# Patient Record
Sex: Male | Born: 1965
Health system: Southern US, Community
[De-identification: ages and names within clinical notes are randomized; demographics above are authoritative.]

## PROBLEM LIST (undated history)

## (undated) DIAGNOSIS — K5900 Constipation, unspecified: Secondary | ICD-10-CM

## (undated) DIAGNOSIS — Z789 Other specified health status: Secondary | ICD-10-CM

## (undated) DIAGNOSIS — Z8249 Family history of ischemic heart disease and other diseases of the circulatory system: Secondary | ICD-10-CM

## (undated) DIAGNOSIS — R7611 Nonspecific reaction to tuberculin skin test without active tuberculosis: Secondary | ICD-10-CM

## (undated) DIAGNOSIS — E785 Hyperlipidemia, unspecified: Secondary | ICD-10-CM

## (undated) HISTORY — DX: Hyperlipidemia, unspecified: E78.5

## (undated) HISTORY — DX: Other specified health status: Z78.9

## (undated) HISTORY — PX: NO PAST SURGERIES: SHX2092

## (undated) HISTORY — DX: Constipation, unspecified: K59.00

## (undated) HISTORY — DX: Family history of ischemic heart disease and other diseases of the circulatory system: Z82.49

---

## 1998-04-30 ENCOUNTER — Emergency Department (HOSPITAL_COMMUNITY): Admission: EM | Admit: 1998-04-30 | Discharge: 1998-04-30 | Payer: Self-pay | Admitting: Emergency Medicine

## 2003-01-30 ENCOUNTER — Emergency Department (HOSPITAL_COMMUNITY): Admission: EM | Admit: 2003-01-30 | Discharge: 2003-01-30 | Payer: Self-pay | Admitting: Emergency Medicine

## 2003-01-31 ENCOUNTER — Ambulatory Visit (HOSPITAL_COMMUNITY): Admission: RE | Admit: 2003-01-31 | Discharge: 2003-01-31 | Payer: Self-pay | Admitting: Emergency Medicine

## 2009-01-04 HISTORY — PX: COLONOSCOPY: SHX174

## 2009-12-04 ENCOUNTER — Encounter: Admission: RE | Admit: 2009-12-04 | Discharge: 2009-12-04 | Payer: Self-pay | Admitting: Family Medicine

## 2009-12-30 LAB — HM COLONOSCOPY

## 2010-11-04 ENCOUNTER — Emergency Department (HOSPITAL_COMMUNITY)
Admission: EM | Admit: 2010-11-04 | Discharge: 2010-11-04 | Disposition: A | Discharge status: Home / Self Care | Payer: Managed Care, Other (non HMO) | Attending: Emergency Medicine | Admitting: Emergency Medicine

## 2010-11-04 ENCOUNTER — Emergency Department (HOSPITAL_COMMUNITY): Payer: Managed Care, Other (non HMO)

## 2010-11-04 DIAGNOSIS — R1013 Epigastric pain: Secondary | ICD-10-CM | POA: Insufficient documentation

## 2010-11-04 DIAGNOSIS — Z79899 Other long term (current) drug therapy: Secondary | ICD-10-CM | POA: Insufficient documentation

## 2010-11-04 DIAGNOSIS — R079 Chest pain, unspecified: Secondary | ICD-10-CM | POA: Insufficient documentation

## 2010-11-04 LAB — CBC
HCT: 35.7 % — ABNORMAL LOW (ref 39.0–52.0)
Hemoglobin: 12 g/dL — ABNORMAL LOW (ref 13.0–17.0)
MCV: 83.8 fL (ref 78.0–100.0)
RBC: 4.26 MIL/uL (ref 4.22–5.81)
WBC: 3.7 10*3/uL — ABNORMAL LOW (ref 4.0–10.5)

## 2010-11-04 LAB — POCT I-STAT, CHEM 8
Chloride: 100 mEq/L (ref 96–112)
Glucose, Bld: 117 mg/dL — ABNORMAL HIGH (ref 70–99)
HCT: 40 % (ref 39.0–52.0)
Potassium: 3.7 mEq/L (ref 3.5–5.1)

## 2010-11-04 LAB — HEPATIC FUNCTION PANEL
ALT: 21 U/L (ref 0–53)
AST: 18 U/L (ref 0–37)
Bilirubin, Direct: <0.1 mg/dL (ref 0.0–0.3)
Total Bilirubin: 0.3 mg/dL (ref 0.3–1.2)

## 2010-11-04 LAB — DIFFERENTIAL
Eosinophils Relative: 1 % (ref 0–5)
Lymphocytes Relative: 58 % — ABNORMAL HIGH (ref 12–46)
Lymphs Abs: 2.2 10*3/uL (ref 0.7–4.0)
Neutrophils Relative %: 31 % — ABNORMAL LOW (ref 43–77)

## 2011-02-15 ENCOUNTER — Encounter (HOSPITAL_COMMUNITY): Payer: Self-pay | Admitting: Internal Medicine

## 2011-02-15 ENCOUNTER — Inpatient Hospital Stay (HOSPITAL_COMMUNITY)
Admission: EM | Admit: 2011-02-15 | Discharge: 2011-02-17 | DRG: 638 | Disposition: A | Discharge status: Home / Self Care | Payer: Managed Care, Other (non HMO) | Attending: Internal Medicine | Admitting: Internal Medicine

## 2011-02-15 DIAGNOSIS — Z87891 Personal history of nicotine dependence: Secondary | ICD-10-CM

## 2011-02-15 DIAGNOSIS — E876 Hypokalemia: Secondary | ICD-10-CM | POA: Diagnosis present

## 2011-02-15 DIAGNOSIS — Z833 Family history of diabetes mellitus: Secondary | ICD-10-CM

## 2011-02-15 DIAGNOSIS — E162 Hypoglycemia, unspecified: Secondary | ICD-10-CM

## 2011-02-15 DIAGNOSIS — E119 Type 2 diabetes mellitus without complications: Secondary | ICD-10-CM | POA: Diagnosis present

## 2011-02-15 DIAGNOSIS — E871 Hypo-osmolality and hyponatremia: Secondary | ICD-10-CM | POA: Diagnosis present

## 2011-02-15 DIAGNOSIS — E1169 Type 2 diabetes mellitus with other specified complication: Principal | ICD-10-CM | POA: Diagnosis present

## 2011-02-15 HISTORY — DX: Nonspecific reaction to tuberculin skin test without active tuberculosis: R76.11

## 2011-02-15 LAB — GLUCOSE, CAPILLARY: Glucose-Capillary: 63 mg/dL — ABNORMAL LOW (ref 70–99)

## 2011-02-15 LAB — BASIC METABOLIC PANEL
BUN: 14 mg/dL (ref 6–23)
Chloride: 94 mEq/L — ABNORMAL LOW (ref 96–112)
GFR calc Af Amer: >90 mL/min (ref >90)
Glucose, Bld: 91 mg/dL (ref 70–99)
Potassium: 3.4 mEq/L — ABNORMAL LOW (ref 3.5–5.1)

## 2011-02-15 LAB — URINALYSIS, ROUTINE W REFLEX MICROSCOPIC
Bilirubin Urine: NEGATIVE
Glucose, UA: >1000 mg/dL — AB
Hgb urine dipstick: NEGATIVE
Specific Gravity, Urine: 1.026 (ref 1.005–1.030)
Urobilinogen, UA: 0.2 mg/dL (ref 0.0–1.0)

## 2011-02-15 LAB — CBC
HCT: 38.7 % — ABNORMAL LOW (ref 39.0–52.0)
Hemoglobin: 14.1 g/dL (ref 13.0–17.0)
MCH: 28.7 pg (ref 26.0–34.0)
MCHC: 36.4 g/dL — ABNORMAL HIGH (ref 30.0–36.0)
RBC: 4.92 MIL/uL (ref 4.22–5.81)

## 2011-02-15 LAB — DIFFERENTIAL
Basophils Relative: 1 % (ref 0–1)
Lymphs Abs: 2.7 10*3/uL (ref 0.7–4.0)
Monocytes Absolute: 0.4 10*3/uL (ref 0.1–1.0)
Monocytes Relative: 10 % (ref 3–12)
Neutro Abs: 1.2 10*3/uL — ABNORMAL LOW (ref 1.7–7.7)

## 2011-02-15 LAB — URINE MICROSCOPIC-ADD ON

## 2011-02-15 MED ORDER — SODIUM CHLORIDE 0.9 % IJ SOLN: 3.0000 mL | Freq: Two times a day (BID) | INTRAMUSCULAR | Status: DC

## 2011-02-15 MED ORDER — PNEUMOCOCCAL VAC POLYVALENT 25 MCG/0.5ML IJ INJ
0.5000 mL | INJECTION | INTRAMUSCULAR | Status: AC
  Filled 2011-02-15: qty 0.5

## 2011-02-15 MED ORDER — ACETAMINOPHEN 325 MG PO TABS: 650.0000 mg | ORAL_TABLET | Freq: Four times a day (QID) | ORAL | Status: DC | PRN

## 2011-02-15 MED ORDER — SODIUM CHLORIDE 0.9 % IV BOLUS (SEPSIS): 1000.0000 mL | Freq: Once | INTRAVENOUS | Status: DC

## 2011-02-15 MED ORDER — ONDANSETRON HCL 4 MG/2ML IJ SOLN: 4.0000 mg | Freq: Four times a day (QID) | INTRAMUSCULAR | Status: DC | PRN

## 2011-02-15 MED ORDER — SODIUM CHLORIDE 0.9 % IJ SOLN
3.0000 mL | Freq: Two times a day (BID) | INTRAMUSCULAR | Status: DC
  Administered 2011-02-16 – 2011-02-17 (×3): 3 mL via INTRAVENOUS

## 2011-02-15 MED ORDER — SODIUM CHLORIDE 0.9 % IV SOLN: 250.0000 mL | INTRAVENOUS | Status: DC | PRN

## 2011-02-15 MED ORDER — SODIUM CHLORIDE 0.9 % IJ SOLN: 3.0000 mL | INTRAMUSCULAR | Status: DC | PRN

## 2011-02-15 MED ORDER — ACETAMINOPHEN 650 MG RE SUPP: 650.0000 mg | Freq: Four times a day (QID) | RECTAL | Status: DC | PRN

## 2011-02-15 MED ORDER — ONDANSETRON HCL 4 MG PO TABS: 4.0000 mg | ORAL_TABLET | Freq: Four times a day (QID) | ORAL | Status: DC | PRN

## 2011-02-15 NOTE — ED Notes (Signed)
[id number] Ready 

## 2011-02-15 NOTE — ED Notes (Signed)
Was given 20 units of insulin at dr office

## 2011-02-15 NOTE — ED Notes (Signed)
Blurred vision excessive thirst frequent urination  Went to dr  And his sugar over 600

## 2011-02-15 NOTE — ED Provider Notes (Signed)
History     CSN: [id number]  Arrival date & time 02/15/11  1435   First MD Initiated Contact with Patient 02/15/11 1746      Chief Complaint  Patient presents with  . Blurred Vision    (Consider location/radiation/quality/duration/timing/severity/associated sxs/prior treatment) The history is provided by the patient.   the patient is a 46 year old thin , male, who presents to the emergency department complaining of blurred vision, polyuria oliguria, fatigue and approximately 6 pound involuntary weight loss.  He denies pain anywhere.  He denies nausea, vomiting, diarrhea.  He was seen by his primary care physician, where his blood sugar was noted to be greater than 600, so, he was given a dosage of insulin ( 20 Units ) and sent here for further evaluation.  The patient is asymptomatic now.  He has several family members who have, adult onset diabetes.  The patient smokes and drinks alcohol daily.  He is taking no medications  No past medical history on file.  No past surgical history on file.  No family history on file.  History  Substance Use Topics  . Smoking status: Not on file  . Smokeless tobacco: Not on file  . Alcohol Use: Not on file      Review of Systems  Constitutional: Positive for unexpected weight change. Negative for fever and chills.  Eyes: Positive for visual disturbance.  Respiratory: Negative for cough and shortness of breath.   Cardiovascular: Negative for chest pain and leg swelling.  Gastrointestinal: Negative for nausea, vomiting and abdominal pain.  Skin: Negative for rash.  Neurological: Positive for weakness. Negative for headaches.  Psychiatric/Behavioral: Negative for confusion.  All other systems reviewed and are negative.    Allergies  Penicillins  Home Medications   Current Outpatient Rx  Name Route Sig Dispense Refill  . CYCLOBENZAPRINE HCL 10 MG PO TABS Oral Take 5-10 mg by mouth 3 (three) times daily as needed. For muscle spasms      . DICLOFENAC SODIUM 75 MG PO TBEC Oral Take 75 mg by mouth 2 (two) times daily.      BP 124/93  Pulse 101  Temp(Src) 98.2 F (36.8 C) (Oral)  Resp 16  SpO2 97%  Physical Exam  Vitals reviewed. Constitutional: He is oriented to person, place, and time. He appears well-developed and well-nourished. No distress.  HENT:  Head: Normocephalic and atraumatic.  Eyes: EOM are normal. Pupils are equal, round, and reactive to light.  Neck: Normal range of motion. Neck supple.  Cardiovascular: Regular rhythm and normal heart sounds.   No murmur heard.      Tachycardia  Pulmonary/Chest: Effort normal and breath sounds normal. No respiratory distress. He has no wheezes. He has no rales.  Abdominal: Soft. Bowel sounds are normal. He exhibits no distension and no mass. There is no tenderness. There is no rebound and no guarding.  Musculoskeletal: Normal range of motion. He exhibits no edema and no tenderness.  Neurological: He is alert and oriented to person, place, and time. No cranial nerve deficit.  Skin: Skin is warm and dry. He is not diaphoretic.  Psychiatric: He has a normal mood and affect. His behavior is normal.    ED Course  Procedures (including critical care time) 46 year old, male, with new onset diabetes mellitus.  His blood sugar is controlled.  Now.  However, I'm concerned that it may go back up.  We will establish an IV and give him fluids.  We will also perform laboratory testing, and then  determine whether or not.  He needs admission or he can follow up with his physician again in the office.  His primary care doctor is Dr. Mirna Mires  Labs Reviewed  GLUCOSE, CAPILLARY - Abnormal; Notable for the following:    Glucose-Capillary 260 (*)    All other components within normal limits  BASIC METABOLIC PANEL  CBC  DIFFERENTIAL  URINALYSIS, ROUTINE W REFLEX MICROSCOPIC   No results found.   No diagnosis found.  Pt had iatrogenic hypoglycemia related to the insulin he was  given in the office.  We have fed him. However, i am concerned the BG can drop again.  Spoke with Dr. Mikeal Hawthorne. He will admit for monitoring   MDM  New onset diabetes Iatrogenic hypoglycemia        Nicholes Stairs, MD 02/15/11 1944

## 2011-02-15 NOTE — ED Notes (Signed)
CBG 63  

## 2011-02-16 LAB — COMPREHENSIVE METABOLIC PANEL
BUN: 10 mg/dL (ref 6–23)
CO2: 24 mEq/L (ref 19–32)
Chloride: 98 mEq/L (ref 96–112)
Creatinine, Ser: 0.77 mg/dL (ref 0.50–1.35)
GFR calc Af Amer: >90 mL/min (ref >90)
GFR calc non Af Amer: >90 mL/min (ref >90)
Glucose, Bld: 349 mg/dL — ABNORMAL HIGH (ref 70–99)
Total Bilirubin: 0.3 mg/dL (ref 0.3–1.2)

## 2011-02-16 LAB — GLUCOSE, CAPILLARY
Glucose-Capillary: 282 mg/dL — ABNORMAL HIGH (ref 70–99)
Glucose-Capillary: 457 mg/dL — ABNORMAL HIGH (ref 70–99)
Glucose-Capillary: 357 mg/dL — ABNORMAL HIGH (ref 70–99)

## 2011-02-16 LAB — CBC
MCH: 28 pg (ref 26.0–34.0)
RBC: 4.39 MIL/uL (ref 4.22–5.81)
WBC: 3.8 10*3/uL — ABNORMAL LOW (ref 4.0–10.5)

## 2011-02-16 MED ORDER — VITAMIN B-1 100 MG PO TABS
100.0000 mg | ORAL_TABLET | Freq: Every day | ORAL | Status: DC
  Administered 2011-02-16 – 2011-02-17 (×2): 100 mg via ORAL
  Filled 2011-02-16 (×2): qty 1

## 2011-02-16 MED ORDER — INSULIN GLARGINE 100 UNIT/ML ~~LOC~~ SOLN
10.0000 [IU] | Freq: Every day | SUBCUTANEOUS | Status: DC
  Administered 2011-02-16: 10 [IU] via SUBCUTANEOUS
  Filled 2011-02-16: qty 3

## 2011-02-16 MED ORDER — LORAZEPAM 2 MG/ML IJ SOLN
0.0000 mg | Freq: Four times a day (QID) | INTRAMUSCULAR | Status: DC
  Filled 2011-02-16: qty 1

## 2011-02-16 MED ORDER — FOLIC ACID 1 MG PO TABS
1.0000 mg | ORAL_TABLET | Freq: Every day | ORAL | Status: DC
  Administered 2011-02-16 – 2011-02-17 (×2): 1 mg via ORAL
  Filled 2011-02-16 (×2): qty 1

## 2011-02-16 MED ORDER — ADULT MULTIVITAMIN W/MINERALS CH
1.0000 | ORAL_TABLET | Freq: Every day | ORAL | Status: DC
  Administered 2011-02-16 – 2011-02-17 (×2): 1 via ORAL
  Filled 2011-02-16 (×2): qty 1

## 2011-02-16 MED ORDER — LORAZEPAM 1 MG PO TABS: 1.0000 mg | ORAL_TABLET | Freq: Four times a day (QID) | ORAL | Status: DC | PRN

## 2011-02-16 MED ORDER — INSULIN ASPART 100 UNIT/ML ~~LOC~~ SOLN
0.0000 [IU] | Freq: Three times a day (TID) | SUBCUTANEOUS | Status: DC
  Administered 2011-02-16: 5 [IU] via SUBCUTANEOUS
  Administered 2011-02-16: 15 [IU] via SUBCUTANEOUS
  Administered 2011-02-16: 7 [IU] via SUBCUTANEOUS
  Filled 2011-02-16: qty 3

## 2011-02-16 MED ORDER — INSULIN ASPART 100 UNIT/ML ~~LOC~~ SOLN
0.0000 [IU] | Freq: Three times a day (TID) | SUBCUTANEOUS | Status: DC
  Administered 2011-02-17 (×2): 5 [IU] via SUBCUTANEOUS

## 2011-02-16 MED ORDER — INSULIN PEN STARTER KIT
1.0000 | Freq: Once | Status: AC
  Administered 2011-02-16: 1
  Filled 2011-02-16: qty 1

## 2011-02-16 MED ORDER — LORAZEPAM 2 MG/ML IJ SOLN: 0.0000 mg | Freq: Two times a day (BID) | INTRAMUSCULAR | Status: DC

## 2011-02-16 MED ORDER — LORAZEPAM 2 MG/ML IJ SOLN: 1.0000 mg | Freq: Four times a day (QID) | INTRAMUSCULAR | Status: DC | PRN

## 2011-02-16 MED ORDER — LIVING WELL WITH DIABETES BOOK
Freq: Once | Status: AC
  Administered 2011-02-16: 18:00:00
  Filled 2011-02-16: qty 1

## 2011-02-16 MED ORDER — THIAMINE HCL 100 MG/ML IJ SOLN
100.0000 mg | Freq: Every day | INTRAMUSCULAR | Status: DC
  Filled 2011-02-16 (×2): qty 1

## 2011-02-16 NOTE — Progress Notes (Signed)
Nutrition consult has BEEN ORDER FOR PT. diabetes VIDEOS HAS BEEN SET UP IN THE ROOM FOR PT. AND HIS WIFE. Diabetes  HAND OUT HAS BEEN GIVEN TO PT.

## 2011-02-16 NOTE — Progress Notes (Signed)
Pt. Received the education book and the kit from the pharmacy,pt practice for first time how to give insulin himself.keep assessing pt's education needs.

## 2011-02-16 NOTE — Progress Notes (Signed)
INITIAL ADULT NUTRITION ASSESSMENT Date: 02/16/2011   Time: 2:48 PM  Reason for Assessment: Consult (DM education)  ASSESSMENT: Male 46 y.o.  Dx: Hypoglycemia  Hx:  Past Medical History  Diagnosis Date  . Positive TB test     "took the treatment ~ 1993"  . Diabetes mellitus     dx'd 02/15/11    Related Meds:     . Flexpen Starter Kit  1 kit Other Once  . folic acid  1 mg Oral Daily  . insulin aspart  0-9 Units Subcutaneous TID WC  . living well with diabetes book   Does not apply Once  . LORazepam  0-4 mg Intravenous Q6H   Followed by  . LORazepam  0-4 mg Intravenous Q12H  . mulitivitamin with minerals  1 tablet Oral Daily  . pneumococcal 23 valent vaccine  0.5 mL Intramuscular Tomorrow-1000  . sodium chloride  1,000 mL Intravenous Once  . sodium chloride  3 mL Intravenous Q12H  . sodium chloride  3 mL Intravenous Q12H  . thiamine  100 mg Oral Daily   Or  . thiamine  100 mg Intravenous Daily    Ht: 5\' 8"  (172.7 cm)  Wt: 151 lb 8 oz (68.72 kg)  Ideal Wt: 70 kg % Ideal Wt: 98%  Usual Wt: --- % Usual Wt: ---  Body mass index is 23.04 kg/(m^2).  Food/Nutrition Related Hx: weight loss PTA   Labs:  CMP     Component Value Date/Time   NA 134* 02/16/2011 0552   K 4.2 02/16/2011 0552   CL 98 02/16/2011 0552   CO2 24 02/16/2011 0552   GLUCOSE 349* 02/16/2011 0552   BUN 10 02/16/2011 0552   CREATININE 0.77 02/16/2011 0552   CALCIUM 9.4 02/16/2011 0552   PROT 6.8 02/16/2011 0552   ALBUMIN 3.6 02/16/2011 0552   AST 25 02/16/2011 0552   ALT 26 02/16/2011 0552   ALKPHOS 92 02/16/2011 0552   BILITOT 0.3 02/16/2011 0552   GFRNONAA >90 02/16/2011 0552   GFRAA >90 02/16/2011 0552      Total I/O In: 240 [P.O.:240] Out: 0    CBG (last 3)   Basename 02/15/11 2113 02/15/11 1857 02/15/11 1504  GLUCAP 335* 63* 260*    Diet Order: Carbohydrate Modified Medium Calorie  Supplements/Tube Feeding: N/A  IVF: N/A  Estimated Nutritional Needs:   Kcal: 1900-2100 Protein:  95-105 gm Fluid: 1.9-2.1 L  RD consult for DM diet education -- reviewed guidelines and recommendations; handouts provided from [employer]; pt reported weight loss PTA due to poor intake, weakness and blurred vision -- he's unsure of the exact quantity; currently his appetite is good and he's consuming 100% of meals  NUTRITION DIAGNOSIS: -Food and nutrition related knowledge deficit (NB-1.1).  Status: Resolved  RELATED TO: lack of prior exposure to accurate nutrition-related information  AS EVIDENCE BY: newly dx DM  MONITORING/EVALUATION(Goals): Goal: discuss the effect of diet & nutrition on the body and its effect on DM; pt to identify 2 diet guidelines/modifications, met Monitor: need for additional education  EDUCATION NEEDS: -Education needs addressed  INTERVENTION:  No further nutrition intervention at this time  RD to follow for care plan  Dietitian #: [phone]  DOCUMENTATION CODES Per approved criteria  -Not Applicable    Alger Memos 02/16/2011, 2:48 PM

## 2011-02-16 NOTE — Progress Notes (Signed)
Pt. Admitted to 6714 on 02-15-2011 just before 2100 from ED.  Placed in tele bed.  Pt. Here from home.  Cognition clear.  A&O.  Answers questions promptly and clearly.  Walks steadily w/o assistance.  Skin clean, dry, intact and supple.  Pt. Oriented to unit, staff, safety and policies.  Zara Chess, RN, RS Research officer, political party).

## 2011-02-16 NOTE — Progress Notes (Signed)
Patient admitted with new onset diabetes.  Sees Dr. Mirna Mires as outpatient.  A1C 12.9% (02/15/11).    Spoke with pt about new diagnosis.  Discussed A1C results with him and explained what an A1C is, basic pathophysiology of DM Type 2, basic home care, importance of checking CBGs and maintaining good CBG control to prevent long-term and short-term complications.  Reviewed signs and symptoms of hyperglycemia and hypoglycemia.  RNs to provide ongoing basic DM education at bedside with this patient.  Have ordered educational booklet, insulin starter kit, and DM videos.  RD consult has been ordered for diet education.  Suggested to patient that he may want to look into getting an appointment with an endocrinologist to further manage his diabetes.  Pt open and receptive to all information.    Pt told me he purchased a CBG meter yesterday at [facility] before he was brought to the hospital.  Encouraged pt to check his CBGs at least tid at home before all meals and to record the results.  Reminded pt that his physician will need CBG data to make decisions about his medication regimen.  Not sure if patient will go home on insulin.  Likely needs insulin as his A1C was 12.9%.  Pt told me he is willing to do what he needs to do to get better.  Will order insulin instruction just in case pt is d/c'd home on insulin.  MD- May want to start Lantus insulin (0.2 units/kg= 10-15 units QHS).    MD- Please place order for "Consult Diabetes OP Education" so pt may attend follow-up session with Certified Diabetes Educator after d/c at the [facility].  Will follow. Ambrose Finland RN, MSN, CDE Diabetes Coordinator Inpatient Diabetes Program [phone]

## 2011-02-16 NOTE — Progress Notes (Signed)
Subjective: Patient rate and alert. Family at the bedside. Patient was having symptoms for one week with polyuria polydipsia and polyphagia. The patient does not know of a history of diabetes in his immediate family. His body habitus is not that is somebody who has insulin assistance. Presently he has no complaints except that he is anxious to go home   Objective: Filed Vitals:   02/15/11 2100 02/16/11 0518 02/16/11 0945 02/16/11 1553  BP: 125/86 107/74 116/81 108/74  Pulse: 89 83 91 88  Temp: 98.6 F (37 C) 98.4 F (36.9 C) 97.3 F (36.3 C) 98.4 F (36.9 C)  TempSrc: Oral Oral Oral Oral  Resp: 16 18 18 18   Height: 5\' 8"  (1.727 m)     Weight: 68.72 kg (151 lb 8 oz)     SpO2: 100% 97% 97% 98%   Weight change:   Intake/Output Summary (Last 24 hours) at 02/16/11 1744 Last data filed at 02/16/11 1400  Gross per 24 hour  Intake    480 ml  Output      0 ml  Net    480 ml    General: Alert, awake, oriented x3, in no acute distress.  HEENT: [place]/AT PEERL, EOMI Neck: Trachea midline,  no masses, no thyromegal,y no JVD, no carotid bruit OROPHARYNX:  Moist, No exudate/ erythema/lesions.  Heart: Regular rate and rhythm, without murmurs, rubs, gallops, PMI non-displaced, no heaves or thrills on palpation.  Lungs: Clear to auscultation, no wheezing or rhonchi noted. No increased vocal fremitus resonant to percussion  Abdomen: Soft, nontender, nondistended, positive bowel sounds, no masses no hepatosplenomegaly noted..  Neuro: No focal neurological deficits noted cranial nerves II through XII grossly intact. DTRs 2+ bilaterally upper and lower extremities. Strength 5/5 in bilateral upper and lower extremities. Musculoskeletal: No warm swelling or erythema around joints, no spinal tenderness noted. Psychiatric: Patient alert and oriented x3, good insight and cognition, good recent to remote recall. Lymph node survey: No cervical axillary or inguinal lymphadenopathy noted.     Lab  Results:  [facility] 02/16/11 0552 02/15/11 1800  NA 134* 133*  K 4.2 3.4*  CL 98 94*  CO2 24 25  GLUCOSE 349* 91  BUN 10 14  CREATININE 0.77 0.74  CALCIUM 9.4 10.4  MG -- --  PHOS -- --    Basename 02/16/11 0552  AST 25  ALT 26  ALKPHOS 92  BILITOT 0.3  PROT 6.8  ALBUMIN 3.6   No results found for this basename: LIPASE:2,AMYLASE:2 in the last 72 hours  Basename 02/16/11 0552 02/15/11 1800  WBC 3.8* 4.4  NEUTROABS -- 1.2*  HGB 12.3* 14.1  HCT 35.2* 38.7*  MCV 80.2 78.7  PLT 250 271   No results found for this basename: CKTOTAL:3,CKMB:3,CKMBINDEX:3,TROPONINI:3 in the last 72 hours No components found with this basename: POCBNP:3 No results found for this basename: DDIMER:2 in the last 72 hours  Basename 02/15/11 2153  HGBA1C 12.9*   No results found for this basename: CHOL:2,HDL:2,LDLCALC:2,TRIG:2,CHOLHDL:2,LDLDIRECT:2 in the last 72 hours  Basename 02/15/11 2153  TSH 1.651  T4TOTAL --  T3FREE --  THYROIDAB --   No results found for this basename: VITAMINB12:2,FOLATE:2,FERRITIN:2,TIBC:2,IRON:2,RETICCTPCT:2 in the last 72 hours  Micro Results: No results found for this or any previous visit (from the past 240 hour(s)).  Studies/Results: No results found.  Medications: I have reviewed the patient's current medications. Scheduled Meds:   . Flexpen Starter Kit  1 kit Other Once  . folic acid  1 mg Oral Daily  .  insulin aspart  0-15 Units Subcutaneous TID WC  . insulin glargine  10 Units Subcutaneous QHS  . living well with diabetes book   Does not apply Once  . LORazepam  0-4 mg Intravenous Q6H   Followed by  . LORazepam  0-4 mg Intravenous Q12H  . mulitivitamin with minerals  1 tablet Oral Daily  . pneumococcal 23 valent vaccine  0.5 mL Intramuscular Tomorrow-1000  . sodium chloride  1,000 mL Intravenous Once  . sodium chloride  3 mL Intravenous Q12H  . sodium chloride  3 mL Intravenous Q12H  . thiamine  100 mg Oral Daily   Or  . thiamine  100 mg  Intravenous Daily  . DISCONTD: insulin aspart  0-9 Units Subcutaneous TID WC   Continuous Infusions:  PRN Meds:.sodium chloride, acetaminophen, acetaminophen, LORazepam, LORazepam, ondansetron (ZOFRAN) IV, ondansetron, sodium chloride Assessment/Plan: Patient Active Hospital Problem List: Hypoglycemia (02/15/2011)   Assessment: Patient had a significant episode of hypoglycemia his doctor's office yesterday. It is unclear as to why he has such a dramatic response to insulin. However here in the hospital the patient's blood sugars have been running from 200 to greater than 400. And will institute a basal bolus regimen of insulin at this time    New Onset Diabetes mellitus (02/15/2011)   Assessment: New onset diabetes lipase and without significant family history. Given the patient's appearance and body habitus is quite possible this patient could be a type I.the patient will definitely be discharged to insulin. I will go ahead and check antibodies for type I. It is likely that his primary care physician wanted to follow up on this given the fact that there will take some time to be resulted. Will also check a fasting lipid profile this patient to ensure that we do not need to institute anti-lipidemic therapy. Insulin teaching has been started and instruction in administering insulin will be completed by his nursing home to light.    Hypokalemia (02/15/2011)   Assessment: Corrected with treatment of his sugars. Potassium today is 4.2     Hyponatremia (02/15/2011)   Assessment: Resolving. Will recheck on tomorrow   Disposition: I anticipate that the patient will likely be ready for discharge within 24-48 hours.       LOS: 1 day

## 2011-02-16 NOTE — Progress Notes (Signed)
Utilization Review Completed.Richard Berg T2/12/2011

## 2011-02-16 NOTE — H&P (Signed)
Richard Berg is an 46 y.o. male.   Chief Complaint: Low blood sugar HPI: 46 yo man who was diagnosed with Diabetes today at the [facility] office after having some blurred vision and weakness. He also had some weight loss. Was sen, evaluated and found to have a sugar of 660. Patient received 20units of SQ Insulin not sure of whether it was short or long acting. His sugar dropped to 60s and he came to the ER. So far seems stable but worried about the sugar dropping further.  Past Medical History  Diagnosis Date  . Positive TB test     "took the treatment ~ 1993"  . Diabetes mellitus     dx'd 02/15/11    Past Surgical History  Procedure Date  . No past surgeries     Family History  Problem Relation Age of Onset  . Diabetes type II Mother   . Diabetes type II Sister   . Diabetes type II Brother    Social History:  reports that he quit smoking about 14 months ago. His smoking use included Cigarettes. He has a 10 pack-year smoking history. He has quit using smokeless tobacco. His smokeless tobacco use included Chew. He reports that he drinks about 25.2 ounces of alcohol per week. He reports that he uses illicit drugs (Marijuana).  Allergies:  Allergies  Allergen Reactions  . Penicillins Hives    Medications Prior to Admission  Medication Dose Route Frequency Provider Last Rate Last Dose  . 0.9 %  sodium chloride infusion  250 mL Intravenous PRN Lonia Blood, MD      . acetaminophen (TYLENOL) tablet 650 mg  650 mg Oral Q6H PRN Lonia Blood, MD       Or  . acetaminophen (TYLENOL) suppository 650 mg  650 mg Rectal Q6H PRN Lonia Blood, MD      . folic acid (FOLVITE) tablet 1 mg  1 mg Oral Daily Maren Reamer, NP      . insulin aspart (novoLOG) injection 0-9 Units  0-9 Units Subcutaneous TID WC Maren Reamer, NP      . LORazepam (ATIVAN) injection 0-4 mg  0-4 mg Intravenous Q6H Maren Reamer, NP       Followed by  . LORazepam (ATIVAN) injection 0-4 mg  0-4 mg Intravenous Q12H Maren Reamer,  NP      . LORazepam (ATIVAN) tablet 1 mg  1 mg Oral Q6H PRN Maren Reamer, NP       Or  . LORazepam (ATIVAN) injection 1 mg  1 mg Intravenous Q6H PRN Maren Reamer, NP      . mulitivitamin with minerals tablet 1 tablet  1 tablet Oral Daily Maren Reamer, NP      . ondansetron [facility]) tablet 4 mg  4 mg Oral Q6H PRN Lonia Blood, MD       Or  . ondansetron (ZOFRAN) injection 4 mg  4 mg Intravenous Q6H PRN Lonia Blood, MD      . pneumococcal 23 valent vaccine (PNU-IMMUNE) injection 0.5 mL  0.5 mL Intramuscular Tomorrow-1000 Michelle A. Ashley Royalty, MD      . sodium chloride 0.9 % bolus 1,000 mL  1,000 mL Intravenous Once Nicholes Stairs, MD      . sodium chloride 0.9 % injection 3 mL  3 mL Intravenous Q12H Lonia Blood, MD      . sodium chloride 0.9 % injection 3 mL  3 mL Intravenous Q12H Lonia Blood, MD      . sodium chloride 0.9 %  injection 3 mL  3 mL Intravenous PRN Lonia Blood, MD      . thiamine (VITAMIN B-1) tablet 100 mg  100 mg Oral Daily Maren Reamer, NP       Or  . thiamine (B-1) injection 100 mg  100 mg Intravenous Daily Maren Reamer, NP       No current outpatient prescriptions on file as of 02/16/2011.    Results for orders placed during the hospital encounter of 02/15/11 (from the past 48 hour(s))  GLUCOSE, CAPILLARY     Status: Abnormal   Collection Time   02/15/11  3:04 PM      Component Value Range Comment   Glucose-Capillary 260 (*) 70 - 99 (mg/dL)   BASIC METABOLIC PANEL     Status: Abnormal   Collection Time   02/15/11  6:00 PM      Component Value Range Comment   Sodium 133 (*) 135 - 145 (mEq/L)    Potassium 3.4 (*) 3.5 - 5.1 (mEq/L)    Chloride 94 (*) 96 - 112 (mEq/L)    CO2 25  19 - 32 (mEq/L)    Glucose, Bld 91  70 - 99 (mg/dL)    BUN 14  6 - 23 (mg/dL)    Creatinine, Ser 9.81  0.50 - 1.35 (mg/dL)    Calcium 19.1  8.4 - 10.5 (mg/dL)    GFR calc non Af Amer >90  >90 (mL/min)    GFR calc Af Amer >90  >90 (mL/min)   CBC     Status: Abnormal   Collection Time   02/15/11   6:00 PM      Component Value Range Comment   WBC 4.4  4.0 - 10.5 (K/uL)    RBC 4.92  4.22 - 5.81 (MIL/uL)    Hemoglobin 14.1  13.0 - 17.0 (g/dL)    HCT 47.8 (*) 29.5 - 52.0 (%)    MCV 78.7  78.0 - 100.0 (fL)    MCH 28.7  26.0 - 34.0 (pg)    MCHC 36.4 (*) 30.0 - 36.0 (g/dL)    RDW 62.1  30.8 - 65.7 (%)    Platelets 271  150 - 400 (K/uL)   DIFFERENTIAL     Status: Abnormal   Collection Time   02/15/11  6:00 PM      Component Value Range Comment   Neutrophils Relative 28 (*) 43 - 77 (%)    Neutro Abs 1.2 (*) 1.7 - 7.7 (K/uL)    Lymphocytes Relative 60 (*) 12 - 46 (%)    Lymphs Abs 2.7  0.7 - 4.0 (K/uL)    Monocytes Relative 10  3 - 12 (%)    Monocytes Absolute 0.4  0.1 - 1.0 (K/uL)    Eosinophils Relative 1  0 - 5 (%)    Eosinophils Absolute 0.1  0.0 - 0.7 (K/uL)    Basophils Relative 1  0 - 1 (%)    Basophils Absolute 0.0  0.0 - 0.1 (K/uL)   URINALYSIS, ROUTINE W REFLEX MICROSCOPIC     Status: Abnormal   Collection Time   02/15/11  6:30 PM      Component Value Range Comment   Color, Urine YELLOW  YELLOW     APPearance CLEAR  CLEAR     Specific Gravity, Urine 1.026  1.005 - 1.030     pH 5.0  5.0 - 8.0     Glucose, UA >1000 (*) NEGATIVE (mg/dL)    Hgb urine dipstick NEGATIVE  NEGATIVE  Bilirubin Urine NEGATIVE  NEGATIVE     Ketones, ur 15 (*) NEGATIVE (mg/dL)    Protein, ur NEGATIVE  NEGATIVE (mg/dL)    Urobilinogen, UA 0.2  0.0 - 1.0 (mg/dL)    Nitrite NEGATIVE  NEGATIVE     Leukocytes, UA NEGATIVE  NEGATIVE    URINE MICROSCOPIC-ADD ON     Status: Abnormal   Collection Time   02/15/11  6:30 PM      Component Value Range Comment   Squamous Epithelial / LPF RARE  RARE     WBC, UA 0-2  <3 (WBC/hpf)    Casts GRANULAR CAST (*) NEGATIVE  FEW  GLUCOSE, CAPILLARY     Status: Abnormal   Collection Time   02/15/11  6:57 PM      Component Value Range Comment   Glucose-Capillary 63 (*) 70 - 99 (mg/dL)   GLUCOSE, CAPILLARY     Status: Abnormal   Collection Time   02/15/11  9:13  PM      Component Value Range Comment   Glucose-Capillary 335 (*) 70 - 99 (mg/dL)    Comment 1 Documented in Chart      Comment 2 Notify RN     HEMOGLOBIN A1C     Status: Abnormal   Collection Time   02/15/11  9:53 PM      Component Value Range Comment   Hemoglobin A1C 12.9 (*) <5.7 (%)    Mean Plasma Glucose 324 (*) <117 (mg/dL)   TSH     Status: Normal   Collection Time   02/15/11  9:53 PM      Component Value Range Comment   TSH 1.651  0.350 - 4.500 (uIU/mL)    No results found.  Review of Systems  Constitutional: Positive for malaise/fatigue.  Eyes: Positive for double vision.  All other systems reviewed and are negative.    Blood pressure 125/86, pulse 89, temperature 98.6 F (37 C), temperature source Oral, resp. rate 16, height 5\' 8"  (1.727 m), weight 68.72 kg (151 lb 8 oz), SpO2 100.00%. Physical Exam  Nursing note and vitals reviewed. Constitutional: He is oriented to person, place, and time. He appears well-developed and well-nourished.  HENT:  Head: Normocephalic and atraumatic.  Right Ear: External ear normal.  Left Ear: External ear normal.  Nose: Nose normal.  Mouth/Throat: Oropharynx is clear and moist.  Eyes: Conjunctivae and EOM are normal. Pupils are equal, round, and reactive to light.  Neck: Normal range of motion. Neck supple.  Cardiovascular: Normal rate, regular rhythm, normal heart sounds and intact distal pulses.   Respiratory: Effort normal and breath sounds normal.  GI: Soft. Bowel sounds are normal.  Musculoskeletal: Normal range of motion.  Neurological: He is alert and oriented to person, place, and time. He has normal reflexes.  Skin: Skin is warm and dry.  Psychiatric: He has a normal mood and affect. His behavior is normal. Judgment and thought content normal.     Assessment/Plan 1. Hypoglycemia: Admit for observation and serial CBGs. Given meal and glucose. If CBGs continue to drop, we will start Dextrose water IV.  2. Newly Diagnosed  DM: patient has positive family history. Will probably do well with Diet, exercise and oral hypoglycemics once his sugar is stable. Check HBA1C and Diabetic education. 3. Hypokalemia: replete 4. Hyponatremia: Hydrate with saline if persistent.   Royal Beirne,LAWAL 02/16/2011, 4:58 AM

## 2011-02-17 LAB — LIPID PANEL
LDL Cholesterol: UNDETERMINED mg/dL (ref 0–99)
VLDL: UNDETERMINED mg/dL (ref 0–40)

## 2011-02-17 MED ORDER — INSULIN GLARGINE 100 UNIT/ML ~~LOC~~ SOLN: 10.0000 [IU] | Freq: Every day | SUBCUTANEOUS | Status: DC

## 2011-02-17 MED ORDER — INSULIN ASPART 100 UNIT/ML ~~LOC~~ SOLN: 0.0000 [IU] | Freq: Three times a day (TID) | SUBCUTANEOUS | Status: DC

## 2011-02-17 MED ORDER — ALUM & MAG HYDROXIDE-SIMETH 200-200-20 MG/5ML PO SUSP
30.0000 mL | Freq: Four times a day (QID) | ORAL | Status: DC | PRN
  Administered 2011-02-17: 30 mL via ORAL
  Filled 2011-02-17 (×2): qty 30

## 2011-02-17 NOTE — Progress Notes (Signed)
Pt able to successfully administer his Insulin with RN supervision.

## 2011-02-17 NOTE — Progress Notes (Signed)
Pt received additional DM education handouts. Pt was able to verbalized and demonstrate proper self administration with Lantus pen. Will continue to assess pt and family educational needs

## 2011-02-17 NOTE — Progress Notes (Signed)
Pt successfully administered insulin with RN supervision

## 2011-02-17 NOTE — Discharge Summary (Signed)
Physician Discharge Summary  Patient ID: Richard Berg MRN: [id number] DOB/AGE: 08-26-1965 46 y.o.  Admit date: 02/15/2011 Discharge date: 02/17/2011  Primary Care Physician:  Evlyn Courier, MD, MD   Discharge Diagnoses:    Principal Problem:  *Hypoglycemia Active Problems:  New Onset Diabetes mellitus  Hypokalemia  Hyponatremia    Medication List  As of 02/17/2011  3:18 PM   STOP taking these medications         diclofenac 75 MG EC tablet         TAKE these medications         cyclobenzaprine 10 MG tablet   Commonly known as: FLEXERIL   Take 5-10 mg by mouth 3 (three) times daily as needed. For muscle spasms      insulin aspart 100 UNIT/ML injection   Commonly known as: novoLOG   Inject 0-15 Units into the skin 3 (three) times daily with meals.      insulin glargine 100 UNIT/ML injection   Commonly known as: LANTUS   Inject 10 Units into the skin at bedtime.             Disposition and Follow-up:  Will be discharged home today in stable and improved condition. Will need to followup with his primary care physician in about 5-7 days for diabetes care.  Consults:  None    Significant Diagnostic Studies:  No results found.  Brief H and P: For complete details please refer to admission H and P, but in brief patient is a 46 yo man who was diagnosed with Diabetes today at the [facility] office after having some blurred vision and weakness. He also had some weight loss. Was sen, evaluated and found to have a sugar of 660. Patient received 20units of SQ Insulin not sure of whether it was short or long acting. His sugar dropped to 60s and he came to the ER. So far seems stable but worried about the sugar dropping further.     Hospital Course:  Principal Problem:  *Hypoglycemia Active Problems:  New Onset Diabetes mellitus  Hypokalemia  Hyponatremia   #1 New onset diabetes mellitus: Given his age and body habitus, I suspect that this is likely type 1  diabetes. He will be discharged on insulin regimen consistent of Lantus and sliding scale. He is now proficient in injecting insulin, has been given instructions on diet and will need close followup with his primary care physician for further insulin titration. He did have one episode of hypoglycemia immediately after he received insulin at his primary care physician's office, however he has not had any further episodes in the hospital.  Patient has been deemed medically stable for discharge home today.  Time spent on Discharge: Greater than 30 minutes.  SignedChaya Jan [facility] Pager: [phone] 02/17/2011, 3:18 PM

## 2011-03-22 ENCOUNTER — Ambulatory Visit: Payer: Managed Care, Other (non HMO) | Admitting: *Deleted

## 2011-03-22 ENCOUNTER — Encounter: Payer: Self-pay | Admitting: *Deleted

## 2011-03-22 ENCOUNTER — Encounter: Payer: Managed Care, Other (non HMO) | Attending: Family Medicine | Admitting: *Deleted

## 2011-03-22 DIAGNOSIS — Z713 Dietary counseling and surveillance: Secondary | ICD-10-CM | POA: Insufficient documentation

## 2011-03-22 DIAGNOSIS — E119 Type 2 diabetes mellitus without complications: Secondary | ICD-10-CM | POA: Insufficient documentation

## 2011-03-22 NOTE — Progress Notes (Signed)
  Medical Nutrition Therapy:  Appt start time: 1130 end time:  1300.   Assessment:  Primary concerns today: Patient here with his wife who appears supportive. They are here to learn more about diabetes, carb counting and insulin action. He works as a Production designer, theatre/television/film for a Cabin crew and works from [employer] to [employer] or more. He was diagnosed with diabetes about 1 month ago with sudden onset of polydipsia, polyuria, blurred vision and weight loss. He is now checking his BG 2-3 times a day before meals and taking Sliding Scale Novolog insulin for BGs starting above 120 mg/dl.  MEDICATIONS: see list. Diabetes medications include Lantus at night and Novolog at meals   DIETARY INTAKE:  Usual eating pattern includes 3 meals and 1-2 snacks per day.  Everyday foods include good variety of all food groups.  Avoided foods include foods high in sugar, fried foods.    24-hr recall:  B ( AM): used to skip occasionally, 1 egg, bacon, unsweetened cereal with 1 cup 2% milk, coffee with Sweet and Low Snk ( AM): none  L ( PM): bring from home, [place] sandwich with salad, OR left overs ,  water Snk ( PM): yogurt and fruit, D ( PM): chicken or fish, starch, vegetables, or salad, roll, water or diet Coke Snk ( PM): none Beverages: coffee, water, diet soda  Usual physical activity: yard work in season  Estimated energy needs: 2000 calories 225 g carbohydrates 150 g protein 56 g fat  Progress Towards Goal(s):  In progress.   Nutritional Diagnosis:  NB-1.1 Food and nutrition-related knowledge deficit As related to diabetes.  As evidenced by no previous diabetes education.    Intervention:  Nutrition counseling and diabetes education provided including basic physiology of Type 1 and Type 2 diabetes, HgA1c, carb counting and reading food labels, causes, symptoms and treatment of low BG. Goals: Follow Diabetes Meal Plan as instructed Eat 3 meals and snacks if hungry, Limit carbohydrate intake to 60 grams +/-  15 grams carbohydrate/meal Limit carbohydrate intake to 0-30 grams carbohydrate/snack if hungry or going to be more active Add lean protein foods to meals/snacks Monitor glucose levels as instructed by your doctor Bring food record and glucose log to your next nutrition visit  Handouts given during visit include:  Living Well with Diabetes  Carb Counting and Food Label handouts  Meal plan card  Monitoring/Evaluation:  Dietary intake, exercise, reading food labels, and body weight in 4 week(s).

## 2011-03-22 NOTE — Patient Instructions (Signed)
Goals:  Follow Diabetes Meal Plan as instructed  Eat 3 meals and snacks if hungry,  Limit carbohydrate intake to 60 grams +/- 15 grams carbohydrate/meal  Limit carbohydrate intake to 0-30 grams carbohydrate/snack if hungry or going to be more active  Add lean protein foods to meals/snacks  Monitor glucose levels as instructed by your doctor  Bring food record and glucose log to your next nutrition visit

## 2013-10-16 ENCOUNTER — Ambulatory Visit
Admission: RE | Admit: 2013-10-16 | Discharge: 2013-10-16 | Disposition: A | Discharge status: Home / Self Care | Payer: 59 | Source: Ambulatory Visit | Attending: Family Medicine | Admitting: Family Medicine

## 2013-10-16 ENCOUNTER — Other Ambulatory Visit: Payer: Self-pay | Admitting: Family Medicine

## 2013-10-16 DIAGNOSIS — R1031 Right lower quadrant pain: Secondary | ICD-10-CM

## 2013-11-15 ENCOUNTER — Encounter: Payer: Self-pay | Admitting: Medical

## 2013-11-15 ENCOUNTER — Ambulatory Visit (INDEPENDENT_AMBULATORY_CARE_PROVIDER_SITE_OTHER): Payer: Managed Care, Other (non HMO) | Admitting: Medical

## 2013-11-15 VITALS — BP 124/90 | HR 60 | Temp 98.0°F | Resp 15 | Ht 69.0 in | Wt 165.0 lb

## 2013-11-15 DIAGNOSIS — R21 Rash and other nonspecific skin eruption: Secondary | ICD-10-CM

## 2013-11-15 DIAGNOSIS — N481 Balanitis: Secondary | ICD-10-CM

## 2013-11-15 DIAGNOSIS — E1165 Type 2 diabetes mellitus with hyperglycemia: Secondary | ICD-10-CM

## 2013-11-15 DIAGNOSIS — K649 Unspecified hemorrhoids: Secondary | ICD-10-CM | POA: Insufficient documentation

## 2013-11-15 DIAGNOSIS — IMO0002 Reserved for concepts with insufficient information to code with codable children: Secondary | ICD-10-CM

## 2013-11-15 DIAGNOSIS — K648 Other hemorrhoids: Secondary | ICD-10-CM

## 2013-11-15 DIAGNOSIS — R1031 Right lower quadrant pain: Secondary | ICD-10-CM

## 2013-11-15 DIAGNOSIS — R030 Elevated blood-pressure reading, without diagnosis of hypertension: Secondary | ICD-10-CM

## 2013-11-15 DIAGNOSIS — K4090 Unilateral inguinal hernia, without obstruction or gangrene, not specified as recurrent: Secondary | ICD-10-CM

## 2013-11-15 DIAGNOSIS — K5909 Other constipation: Secondary | ICD-10-CM

## 2013-11-15 DIAGNOSIS — K6289 Other specified diseases of anus and rectum: Secondary | ICD-10-CM

## 2013-11-15 DIAGNOSIS — K644 Residual hemorrhoidal skin tags: Secondary | ICD-10-CM

## 2013-11-15 MED ORDER — KETOCONAZOLE 2 % EX CREA: 1.0000 "application " | TOPICAL_CREAM | Freq: Every day | CUTANEOUS | Status: DC

## 2013-11-15 MED ORDER — HYDROCORTISONE 2.5 % RE CREA: 1.0000 "application " | TOPICAL_CREAM | Freq: Two times a day (BID) | RECTAL | Status: DC

## 2013-11-15 MED ORDER — LUBIPROSTONE 24 MCG PO CAPS: 24.0000 ug | ORAL_CAPSULE | Freq: Two times a day (BID) | ORAL | Status: DC

## 2013-11-15 NOTE — Progress Notes (Signed)
Subjective: Here as a new patient today, his wife comes to this practice. Was formally seen in the [place] [facility].  Wants to establish and change primary providers, has several questions today.  Has a history of type 2 diabetes diagnosed in 2013 after becoming symptomatic, having blurred vision, polydipsia , polyuria, and simultaneously labs noted on insurance physical were abnormal.  Initially was 700 blood sugar, admitted to the hospital and since then has been on meal-time NovoLog sliding scale and long-acting insulin at bedtime.  Take Lantus 10 units at night, NovoLog sliding scale mealtime, checks sugars regularly, has had recent labs done earlier this month.  He exercises most days, checks sugar on average twice daily.  He reports intermittent pain in his right lower abdomen/right groin region for several months was seen last month for the same, was sent for x-ray of his belly which is normal, still getting the pains and not sure about this.  Denies any urinary complaints, no blood in urine, no urinary frequency.    He does have constipation issues, lately bowel movements have been hard smaller pebbles rather than regular soft formed stools, has had ongoing problems with hemorrhoids and sometimes blood in the stool, for a long time as had a sharp pain right at the anus with bowel movements  A few weeks ago had a scattered rash on his forearms that resolved, recently had a boil-like lesion on the upper inner thigh that resolved on its own, and currently has an irritated itchy red rash on the penis this been there for a few weeks  ROS as in subjective  Objective: Filed Vitals:   11/15/13 1420  BP: 124/90  Pulse: 60  Temp: 98 F (36.7 C)  Resp: 15    General appearance: alert, no distress, WD/WN, AA male Neck: supple, no lymphadenopathy, no thyromegaly, no masses Heart: RRR, normal S1, S2, no murmurs Lungs: CTA bilaterally, no wheezes, rhonchi, or rales Abdomen: +bs, soft, non tender,  non distended, no masses, no hepatomegaly, no splenomegaly Pulses: 2+ symmetric, upper and lower extremities, normal cap refill GU: normal male genitalia, circumcised, patch of erythema and rough skin of distal dorsal penis suggestive of balanitis, no testicular mass or swelling, no lymphadenopathy, but there is a mild direct right inguinal reducible hernia DRE: anus with 2 moderate sized non thrombosed hemorrhoid, small irritated erythematous area at anus, suggestive of small fissure Ext: no edema Skin: no obvious rash other than penile rash above   Assessment: Encounter Diagnoses  Name Primary?  . Diabetes type 2, uncontrolled Yes  . Elevated blood-pressure reading without diagnosis of hypertension   . Other constipation   . Rectal pain   . External hemorrhoid   . Balanitis   . Rash and nonspecific skin eruption   . Right lower quadrant abdominal pain   . Right inguinal hernia     Plan: Diabetes type 2-request records and labs from recent visit at the [facility].  C-peptide lab today, continue Lantus 10 units daily at bedtime, NovoLog sliding scale, healthy diet, exercise, continue checking glucose twice a day  elevated blood pressure without hypertension-continue to monitor  constipation-discussed diet, water intake, fiber intake, begin Amitiza, discussed risk and benefits of medication  Rectal pain, hemorrhoids, advise hot soapy baths soaks for 20 minutes, discussed hygiene, wet wipes when needed, begin Proctosol cream topically for the next week, avoid constipation, straining, heavy lifting  Balanitis-begin ketoconazole topical for the next 1-2 weeks  Rash otherwise resolved, follow-up if needed  Right lower  quadrant pain/right inguinal direct hernia reducible-discussed the findings, diagnosis, possible treatments, when to call return if worsening

## 2013-11-15 NOTE — Patient Instructions (Signed)
Thank you for giving me the opportunity to serve you today.    Your diagnosis today includes: Encounter Diagnoses  Name Primary?  . Diabetes type 2, uncontrolled Yes  . Elevated blood-pressure reading without diagnosis of hypertension   . Other constipation   . Rectal pain   . External hemorrhoid   . Balanitis   . Rash and nonspecific skin eruption   . Right lower quadrant abdominal pain      Specific recommendations today include:  We we will request recent labs that he had done at the [facility]  I am checking a lab today to see if you are producing insulin in your body  Continue your current diabetes medications  For constipation, begin Amitiza capsule twice daily to help with the water balance in the colon  For the next several days use Proctosol HC rectal cream to try to shrink down the hemorrhoids  Soak in hot soapy water 20 minutes daily for the next week  When you have rectal pain or tenderness with wiping, use baby wipes instead of regular toilet paper  Begin ketoconazole topical cream to the red rash of the penis for about a week to let us see if this clears that up  Your belly pain suggest possible direct inguinal hernia that is small.  If this gets progressively worse let me know    I have included other useful information below for your review.  Constipation Constipation is when a person has fewer than three bowel movements a week, has difficulty having a bowel movement, or has stools that are dry, hard, or larger than normal. As people grow older, constipation is more common. If you try to fix constipation with medicines that make you have a bowel movement (laxatives), the problem may get worse. Long-term laxative use may cause the muscles of the colon to become weak. A low-fiber diet, not taking in enough fluids, and taking certain medicines may make constipation worse.  CAUSES   Certain medicines, such as antidepressants, pain medicine, iron  supplements, antacids, and water pills.   Certain diseases, such as diabetes, irritable bowel syndrome (IBS), thyroid disease, or depression.   Not drinking enough water.   Not eating enough fiber-rich foods.   Stress or travel.   Lack of physical activity or exercise.   Ignoring the urge to have a bowel movement.   Using laxatives too much.  SIGNS AND SYMPTOMS   Having fewer than three bowel movements a week.   Straining to have a bowel movement.   Having stools that are hard, dry, or larger than normal.   Feeling full or bloated.   Pain in the lower abdomen.   Not feeling relief after having a bowel movement.  DIAGNOSIS  Your health care provider will take a medical history and perform a physical exam. Further testing may be done for severe constipation. Some tests may include:  A barium enema X-ray to examine your rectum, colon, and, sometimes, your small intestine.   A sigmoidoscopy to examine your lower colon.   A colonoscopy to examine your entire colon. TREATMENT  Treatment will depend on the severity of your constipation and what is causing it. Some dietary treatments include drinking more fluids and eating more fiber-rich foods. Lifestyle treatments may include regular exercise. If these diet and lifestyle recommendations do not help, your health care provider may recommend taking over-the-counter laxative medicines to help you have bowel movements. Prescription medicines may be prescribed if over-the-counter medicines do not work.  HOME CARE INSTRUCTIONS   Eat foods that have a lot of fiber, such as fruits, vegetables, whole grains, and beans.  Limit foods high in fat and processed sugars, such as french fries, hamburgers, cookies, candies, and soda.   A fiber supplement may be added to your diet if you cannot get enough fiber from foods.   Drink enough fluids to keep your urine clear or pale yellow.   Exercise regularly or as directed by  your health care provider.   Go to the restroom when you have the urge to go. Do not hold it.   Only take over-the-counter or prescription medicines as directed by your health care provider. Do not take other medicines for constipation without talking to your health care provider first.  [place] IF:   You have bright red blood in your stool.   Your constipation lasts for more than 4 days or gets worse.   You have abdominal or rectal pain.   You have thin, pencil-like stools.   You have unexplained weight loss. MAKE SURE YOU:   Understand these instructions.  Will watch your condition.  Will get help right away if you are not doing well or get worse. Document Released: 09/19/2003 Document Revised: 12/26/2012 Document Reviewed: 10/02/2012 [facility] Patient Information 2015 [place], [place]. This information is not intended to replace advice given to you by your health care provider. Make sure you discuss any questions you have with your health care provider.   Inguinal Hernia, Adult Muscles help keep everything in the body in its proper place. But if a weak spot in the muscles develops, something can poke through. That is called a hernia. When this happens in the lower part of the belly (abdomen), it is called an inguinal hernia. (It takes its name from a part of the body in this region called the inguinal canal.) A weak spot in the wall of muscles lets some fat or part of the small intestine bulge through. An inguinal hernia can develop at any age. Men get them more often than women. CAUSES  In adults, an inguinal hernia develops over time.  It can be triggered by:  Suddenly straining the muscles of the lower abdomen.  Lifting heavy objects.  Straining to have a bowel movement. Difficult bowel movements (constipation) can lead to this.  Constant coughing. This may be caused by smoking or lung disease.  Being overweight.  Being pregnant.  Working  at a job that requires long periods of standing or heavy lifting.  Having had an inguinal hernia before. One type can be an emergency situation. It is called a strangulated inguinal hernia. It develops if part of the small intestine slips through the weak spot and cannot get back into the abdomen. The blood supply can be cut off. If that happens, part of the intestine may die. This situation requires emergency surgery. SYMPTOMS  Often, a small inguinal hernia has no symptoms. It is found when a healthcare provider does a physical exam. Larger hernias usually have symptoms.   In adults, symptoms may include:  A lump in the groin. This is easier to see when the person is standing. It might disappear when lying down.  In men, a lump in the scrotum.  Pain or burning in the groin. This occurs especially when lifting, straining or coughing.  A dull ache or feeling of pressure in the groin.  Signs of a strangulated hernia can include:  A bulge in the groin that becomes  very painful and tender to the touch.  A bulge that turns red or purple.  Fever, nausea and vomiting.  Inability to have a bowel movement or to pass gas. DIAGNOSIS  To decide if you have an inguinal hernia, a healthcare provider will probably do a physical examination.  This will include asking questions about any symptoms you have noticed.  The healthcare provider might feel the groin area and ask you to cough. If an inguinal hernia is felt, the healthcare provider may try to slide it back into the abdomen.  Usually no other tests are needed. TREATMENT  Treatments can vary. The size of the hernia makes a difference. Options include:  Watchful waiting. This is often suggested if the hernia is small and you have had no symptoms.  No medical procedure will be done unless symptoms develop.  You will need to watch closely for symptoms. If any occur, contact your healthcare provider right away.  Surgery. This is used if  the hernia is larger or you have symptoms.  Open surgery. This is usually an outpatient procedure (you will not stay overnight in a hospital). An cut (incision) is made through the skin in the groin. The hernia is put back inside the abdomen. The weak area in the muscles is then repaired by herniorrhaphy or hernioplasty. Herniorrhaphy: in this type of surgery, the weak muscles are sewn back together. Hernioplasty: a patch or mesh is used to close the weak area in the abdominal wall.  Laparoscopy. In this procedure, a surgeon makes small incisions. A thin tube with a tiny video camera (called a laparoscope) is put into the abdomen. The surgeon repairs the hernia with mesh by looking with the video camera and using two long instruments. HOME CARE INSTRUCTIONS   After surgery to repair an inguinal hernia:  You will need to take pain medicine prescribed by your healthcare provider. Follow all directions carefully.  You will need to take care of the wound from the incision.  Your activity will be restricted for awhile. This will probably include no heavy lifting for several weeks. You also should not do anything too active for a few weeks. When you can return to work will depend on the type of job that you have.  During "watchful waiting" periods, you should:  Maintain a healthy weight.  Eat a diet high in fiber (fruits, vegetables and whole grains).  Drink plenty of fluids to avoid constipation. This means drinking enough water and other liquids to keep your urine clear or pale yellow.  Do not lift heavy objects.  Do not stand for long periods of time.  Quit smoking. This should keep you from developing a frequent cough. SEEK MEDICAL CARE IF:   A bulge develops in your groin area.  You feel pain, a burning sensation or pressure in the groin. This might be worse if you are lifting or straining.  You develop a fever of more than 100.5 F (38.1 C). SEEK IMMEDIATE MEDICAL CARE IF:    Pain in the groin increases suddenly.  A bulge in the groin gets bigger suddenly and does not go down.  For men, there is sudden pain in the scrotum. Or, the size of the scrotum increases.  A bulge in the groin area becomes red or purple and is painful to touch.  You have nausea or vomiting that does not go away.  You feel your heart beating much faster than normal.  You cannot have a bowel movement or pass  gas.  You develop a fever of more than 102.0 F (38.9 C). Document Released: 05/09/2008 Document Revised: 03/15/2011 Document Reviewed: 05/09/2008 [facility] Patient Information 2015 [place], [place]. This information is not intended to replace advice given to you by your health care provider. Make sure you discuss any questions you have with your health care provider.   Hemorrhoids Hemorrhoids are swollen veins around the rectum or anus. There are two types of hemorrhoids:   Internal hemorrhoids. These occur in the veins just inside the rectum. They may poke through to the outside and become irritated and painful.  External hemorrhoids. These occur in the veins outside the anus and can be felt as a painful swelling or hard lump near the anus. CAUSES  Pregnancy.   Obesity.   Constipation or diarrhea.   Straining to have a bowel movement.   Sitting for long periods on the toilet.  Heavy lifting or other activity that caused you to strain.  Anal intercourse. SYMPTOMS   Pain.   Anal itching or irritation.   Rectal bleeding.   Fecal leakage.   Anal swelling.   One or more lumps around the anus.  DIAGNOSIS  Your caregiver may be able to diagnose hemorrhoids by visual examination. Other examinations or tests that may be performed include:   Examination of the rectal area with a gloved hand (digital rectal exam).   Examination of anal canal using a small tube (scope).   A blood test if you have lost a significant amount of blood.  A test to look  inside the colon (sigmoidoscopy or colonoscopy). TREATMENT Most hemorrhoids can be treated at home. However, if symptoms do not seem to be getting better or if you have a lot of rectal bleeding, your caregiver may perform a procedure to help make the hemorrhoids get smaller or remove them completely. Possible treatments include:   Placing a rubber band at the base of the hemorrhoid to cut off the circulation (rubber band ligation).   Injecting a chemical to shrink the hemorrhoid (sclerotherapy).   Using a tool to burn the hemorrhoid (infrared light therapy).   Surgically removing the hemorrhoid (hemorrhoidectomy).   Stapling the hemorrhoid to block blood flow to the tissue (hemorrhoid stapling).  HOME CARE INSTRUCTIONS   Eat foods with fiber, such as whole grains, beans, nuts, fruits, and vegetables. Ask your doctor about taking products with added fiber in them (fibersupplements).  Increase fluid intake. Drink enough water and fluids to keep your urine clear or pale yellow.   Exercise regularly.   Go to the bathroom when you have the urge to have a bowel movement. Do not wait.   Avoid straining to have bowel movements.   Keep the anal area dry and clean. Use wet toilet paper or moist towelettes after a bowel movement.   Medicated creams and suppositories may be used or applied as directed.   Only take over-the-counter or prescription medicines as directed by your caregiver.   Take warm sitz baths for 15-20 minutes, 3-4 times a day to ease pain and discomfort.   Place ice packs on the hemorrhoids if they are tender and swollen. Using ice packs between sitz baths may be helpful.   Put ice in a plastic bag.   Place a towel between your skin and the bag.   Leave the ice on for 15-20 minutes, 3-4 times a day.   Do not use a donut-shaped pillow or sit on the toilet for long periods. This increases blood pooling  and pain.  SEEK MEDICAL CARE IF:  You have  increasing pain and swelling that is not controlled by treatment or medicine.  You have uncontrolled bleeding.  You have difficulty or you are unable to have a bowel movement.  You have pain or inflammation outside the area of the hemorrhoids. MAKE SURE YOU:  Understand these instructions.  Will watch your condition.  Will get help right away if you are not doing well or get worse. Document Released: 12/19/1999 Document Revised: 12/08/2011 Document Reviewed: 10/26/2011 [facility] Patient Information 2015 [place], [place]. This information is not intended to replace advice given to you by your health care provider. Make sure you discuss any questions you have with your health care provider.

## 2013-11-16 LAB — C-PEPTIDE: C-Peptide: 0.75 ng/mL — ABNORMAL LOW (ref 0.80–3.90)

## 2013-12-19 ENCOUNTER — Ambulatory Visit (INDEPENDENT_AMBULATORY_CARE_PROVIDER_SITE_OTHER): Payer: Managed Care, Other (non HMO) | Admitting: Medical

## 2013-12-19 ENCOUNTER — Encounter: Payer: Self-pay | Admitting: Medical

## 2013-12-19 VITALS — BP 100/70 | HR 64 | Temp 98.0°F | Resp 16 | Wt 164.0 lb

## 2013-12-19 DIAGNOSIS — Z794 Long term (current) use of insulin: Secondary | ICD-10-CM

## 2013-12-19 DIAGNOSIS — E785 Hyperlipidemia, unspecified: Secondary | ICD-10-CM

## 2013-12-19 DIAGNOSIS — K5900 Constipation, unspecified: Secondary | ICD-10-CM

## 2013-12-19 DIAGNOSIS — IMO0001 Reserved for inherently not codable concepts without codable children: Secondary | ICD-10-CM

## 2013-12-19 DIAGNOSIS — Z2820 Immunization not carried out because of patient decision for unspecified reason: Secondary | ICD-10-CM

## 2013-12-19 DIAGNOSIS — E119 Type 2 diabetes mellitus without complications: Secondary | ICD-10-CM

## 2013-12-19 DIAGNOSIS — N481 Balanitis: Secondary | ICD-10-CM

## 2013-12-19 LAB — POCT URINALYSIS DIPSTICK
Bilirubin, UA: NEGATIVE
Blood, UA: NEGATIVE
GLUCOSE UA: NEGATIVE
KETONES UA: NEGATIVE
LEUKOCYTES UA: NEGATIVE
Nitrite, UA: NEGATIVE
PROTEIN UA: NEGATIVE
SPEC GRAV UA: 1.025
UROBILINOGEN UA: NEGATIVE
pH, UA: 6

## 2013-12-19 LAB — COMPREHENSIVE METABOLIC PANEL
ALK PHOS: 65 U/L (ref 39–117)
ALT: 19 U/L (ref 0–53)
AST: 18 U/L (ref 0–37)
Albumin: 4.7 g/dL (ref 3.5–5.2)
BILIRUBIN TOTAL: 0.5 mg/dL (ref 0.2–1.2)
BUN: 16 mg/dL (ref 6–23)
CO2: 28 mEq/L (ref 19–32)
CREATININE: 0.93 mg/dL (ref 0.50–1.35)
Calcium: 9.3 mg/dL (ref 8.4–10.5)
Chloride: 101 mEq/L (ref 96–112)
GLUCOSE: 135 mg/dL — AB (ref 70–99)
Potassium: 4.3 mEq/L (ref 3.5–5.3)
Sodium: 137 mEq/L (ref 135–145)
Total Protein: 7.4 g/dL (ref 6.0–8.3)

## 2013-12-19 LAB — LIPID PANEL
CHOL/HDL RATIO: 2.8 ratio
Cholesterol: 194 mg/dL (ref 0–200)
HDL: 69 mg/dL (ref >39)
LDL CALC: 117 mg/dL — AB (ref 0–99)
TRIGLYCERIDES: 39 mg/dL (ref <150)
VLDL: 8 mg/dL (ref 0–40)

## 2013-12-19 NOTE — Patient Instructions (Signed)
Thank you for giving me the opportunity to serve you today.    Your diagnosis today includes: Encounter Diagnoses  Name Primary?  . Insulin dependent diabetes mellitus Yes  . Constipation, unspecified constipation type   . Balanitis   . Hyperlipidemia   . Vaccine refused by patient      Specific recommendations today include:  STOP Amitiza.    Begin trial of Linzess once daily for constipation.  The goal is to see less hard pebble stool, less straining and bloating, less pain.  Check your blood sugar before each meal, write them in a log book and bring these to me in 2-3 weeks  Eat a healthy diabetic diet  Check your feet daily for wounds/sores  See your eye doctor yearly for diabetic eye screening  Exercise reguarly  Don't skip meals  We want to avoid hypoglycemia/low sugars  We will call with lab results   Return pending labs.   I have included other useful information below for your review.  How to Avoid Diabetes Problems You can do a lot to prevent or slow down diabetes problems. Following your diabetes plan and taking care of yourself can reduce your risk of serious or life-threatening complications. Below, you will find certain things you can do to prevent diabetes problems. MANAGE YOUR DIABETES Follow your health care provider's, nurse educator's, and dietitian's instructions for managing your diabetes. They will teach you the basics of diabetes care. They can help answer questions you may have. Learn about diabetes and make healthy choices regarding eating and physical activity. Monitor your blood glucose level regularly. Your health care provider will help you decide how often to check your blood glucose level depending on your treatment goals and how well you are meeting them.  DO NOT USE NICOTINE Nicotine and diabetes are a dangerous combination. Nicotine raises your risk for diabetes problems. If you quit using nicotine, you will lower your risk for heart  attack, stroke, nerve disease, and kidney disease. Your cholesterol and your blood pressure levels may improve. Your blood circulation will also improve. Do not use any tobacco products, including cigarettes, chewing tobacco, or electronic cigarettes. If you need help quitting, ask your health care provider. KEEP YOUR BLOOD PRESSURE UNDER CONTROL Keeping your blood pressure under control will help prevent damage to your eyes, kidneys, heart, and blood vessels. Blood pressure consists of two numbers. The top number should be below 120, and the bottom number should be below 80 (120/80). Keep your blood pressure as close to these numbers as you can. If you already have kidney disease, you may want even lower blood pressure to protect your kidneys. Talk to your health care provider to make sure that your blood pressure goal is right for your needs. Meal planning, medicines, and exercise can help you reach your blood pressure target. Have your blood pressure checked at every visit with your health care provider. KEEP YOUR CHOLESTEROL UNDER CONTROL Normal cholesterol levels will help prevent heart disease and stroke. These are the biggest health problems for people with diabetes. Keeping cholesterol levels under control can also help with blood flow. Have your cholesterol level checked at least once a year. Your health care provider may prescribe a medicine known as a statin. Statins lower your cholesterol. If you are not taking a statin, ask your health care provider if you should be. Meal planning, exercise, and medicines can help you reach your cholesterol targets.  [facility] PHYSICAL EXAMS AND EYE EXAMS  Your health care provider will tell you how often he or she wants to see you depending on your plan of treatment. It is important that you keep these appointments so that possible problems can be identified early and complications can be avoided or treated.  Every visit with your health care  provider should include your weight, blood pressure, and an evaluation of your blood glucose control.  Your hemoglobin A1c should be checked:  At least twice a year if you are at your goal.  Every 3 months if there are changes in treatment.  If you are not meeting your goals.  Your blood lipids should be checked yearly. You should also be checked yearly to see if you have protein in your urine (microalbumin).  Schedule a dilated eye exam within 5 years of your diagnosis if you have type 1 diabetes, and then yearly. Schedule a dilated eye exam at diagnosis if you have type 2 diabetes, and then yearly. All exams thereafter can be extended to every 2 to 3 years if one or more exams have been normal. KEEP YOUR VACCINES CURRENT The flu vaccine is recommended yearly. The formula for the vaccine changes every year and needs to be updated for the best protection against current viruses. It is recommended that people with diabetes who are over 74 years old get the pneumonia vaccine. In some cases, two separate shots may be given. Ask your health care provider if your pneumonia vaccination is up-to-date. However, there are some instances where another vaccine is recommended. Check with your health care provider. TAKE CARE OF YOUR FEET  Diabetes may cause you to have a poor blood supply (circulation) to your legs and feet. Because of this, the skin may be thinner, break easier, and heal more slowly. You also may have nerve damage in your legs and feet, causing decreased feeling. You may not notice minor injuries to your feet that could lead to serious problems or infections. Taking care of your feet is very important. Visual foot exams are performed at every routine medical visit. The exams check for cuts, injuries, or other problems with the feet. A comprehensive foot exam should be done yearly. This includes visual inspection as well as assessing foot pulses and testing for loss of sensation. You should also  do the following:  Inspect your feet daily for cuts, calluses, blisters, ingrown toenails, and signs of infection, such as redness, swelling, or pus.  Wash and dry your feet thoroughly, especially between the toes.  Avoid soaking your feet regularly in hot water baths.  Moisturize dry skin with lotion, avoiding areas between your toes.  Cut toenails straight across and file the edges.  Avoid shoes that do not fit well or have areas that irritate your skin.  Avoid going barefooted or wearing only socks. Your feet need protection. TAKE CARE OF YOUR TEETH People with poorly controlled diabetes are more likely to have gum (periodontal) disease. These infections make diabetes harder to control. Periodontal diseases, if left untreated, can lead to tooth loss. Brush your teeth twice a day, floss, and see your dentist for checkups and cleaning every 6 months, or 2 times a year. ASK YOUR HEALTH CARE PROVIDER ABOUT TAKING ASPIRIN Taking aspirin daily is recommended to help prevent cardiovascular disease in people with and without diabetes. Ask your health care provider if this would benefit you and what dose he or she would recommend. DRINK RESPONSIBLY Moderate amounts of alcohol (less than 1 drink per day for adult  women and less than 2 drinks per day for adult men) have a minimal effect on blood glucose if ingested with food. It is important to eat food with alcohol to avoid hypoglycemia. People should avoid alcohol if they have a history of alcohol abuse or dependence, if they are pregnant, and if they have liver disease, pancreatitis, advanced neuropathy, or severe hypertriglyceridemia. LESSEN STRESS Living with diabetes can be stressful. When you are under stress, your blood glucose may be affected in two ways:  Stress hormones may cause your blood glucose to rise.  You may be distracted from taking good care of yourself. It is a good idea to be aware of your stress level and make changes that  are necessary to help you better manage challenging situations. Support groups, planned relaxation, a hobby you enjoy, meditation, healthy relationships, and exercise all work to lower your stress level. If your efforts do not seem to be helping, get help from your health care provider or a trained mental health professional. Document Released: 09/08/2010 Document Revised: 05/07/2013 Document Reviewed: 02/14/2013 [facility] Patient Information 2015 [place], [place]. This information is not intended to replace advice given to you by your health care provider. Make sure you discuss any questions you have with your health care provider.   Constipation Constipation is when a person has fewer than three bowel movements a week, has difficulty having a bowel movement, or has stools that are dry, hard, or larger than normal. As people grow older, constipation is more common. If you try to fix constipation with medicines that make you have a bowel movement (laxatives), the problem may get worse. Long-term laxative use may cause the muscles of the colon to become weak. A low-fiber diet, not taking in enough fluids, and taking certain medicines may make constipation worse.  CAUSES   Certain medicines, such as antidepressants, pain medicine, iron supplements, antacids, and water pills.   Certain diseases, such as diabetes, irritable bowel syndrome (IBS), thyroid disease, or depression.   Not drinking enough water.   Not eating enough fiber-rich foods.   Stress or travel.   Lack of physical activity or exercise.   Ignoring the urge to have a bowel movement.   Using laxatives too much.  SIGNS AND SYMPTOMS   Having fewer than three bowel movements a week.   Straining to have a bowel movement.   Having stools that are hard, dry, or larger than normal.   Feeling full or bloated.   Pain in the lower abdomen.   Not feeling relief after having a bowel movement.  DIAGNOSIS  Your health  care provider will take a medical history and perform a physical exam. Further testing may be done for severe constipation. Some tests may include:  A barium enema X-ray to examine your rectum, colon, and, sometimes, your small intestine.   A sigmoidoscopy to examine your lower colon.   A colonoscopy to examine your entire colon. TREATMENT  Treatment will depend on the severity of your constipation and what is causing it. Some dietary treatments include drinking more fluids and eating more fiber-rich foods. Lifestyle treatments may include regular exercise. If these diet and lifestyle recommendations do not help, your health care provider may recommend taking over-the-counter laxative medicines to help you have bowel movements. Prescription medicines may be prescribed if over-the-counter medicines do not work.  HOME CARE INSTRUCTIONS   Eat foods that have a lot of fiber, such as fruits, vegetables, whole grains, and beans.  Limit foods high in  fat and processed sugars, such as french fries, hamburgers, cookies, candies, and soda.   A fiber supplement may be added to your diet if you cannot get enough fiber from foods.   Drink enough fluids to keep your urine clear or pale yellow.   Exercise regularly or as directed by your health care provider.   Go to the restroom when you have the urge to go. Do not hold it.   Only take over-the-counter or prescription medicines as directed by your health care provider. Do not take other medicines for constipation without talking to your health care provider first.  [place] IF:   You have bright red blood in your stool.   Your constipation lasts for more than 4 days or gets worse.   You have abdominal or rectal pain.   You have thin, pencil-like stools.   You have unexplained weight loss. MAKE SURE YOU:   Understand these instructions.  Will watch your condition.  Will get help right away if you are not doing  well or get worse. Document Released: 09/19/2003 Document Revised: 12/26/2012 Document Reviewed: 10/02/2012 [facility] Patient Information 2015 [place], [place]. This information is not intended to replace advice given to you by your health care provider. Make sure you discuss any questions you have with your health care provider.

## 2013-12-19 NOTE — Addendum Note (Signed)
Addended by: Armanda Magic on: 12/19/2013 10:34 AM   Modules accepted: Orders

## 2013-12-19 NOTE — Progress Notes (Signed)
Subjective: Here for f/u from first visit here in November on several issues.    Has a history of type 2 diabetes diagnosed in 2013 after becoming symptomatic, having blurred vision, polydipsia , polyuria, and simultaneously labs noted on insurance physical were abnormal.  Initially was 700 blood sugar, admitted to the hospital and since then has been on meal-time NovoLog sliding scale and long-acting insulin at bedtime.  Take Lantus 10 units at night, NovoLog sliding scale mealtime, checks sugars regularly, has had recent labs done earlier this month.  He exercises most days, checks sugar on average twice daily.  Rarely has low reading.   After last visit he was found to have low C-Peptide.   constipation -  Since last visit Amitiza helped some, less pain and bloating, but still gets smaller pebbles rather than regular soft formed stools.    He used the fungal cream on the penis for a few weeks but was getting too itchy.   No other c/o.   ROS as in subjective  Objective: BP 100/70 mmHg  Pulse 64  Temp(Src) 98 F (36.7 C) (Oral)  Resp 16  Wt 164 lb (74.39 kg)  General appearance: alert, no distress, WD/WN, AA male Neck: supple, no lymphadenopathy, no thyromegaly, no masses Heart: RRR, normal S1, S2, no murmurs Lungs: CTA bilaterally, no wheezes, rhonchi, or rales Abdomen: +bs, soft, non tender, non distended, no masses, no hepatomegaly, no splenomegaly Pulses: 2+ symmetric, upper and lower extremities, normal cap refill GU: normal male genitalia, circumcised, patch of erythema and rough skin of distal dorsal penis  Ext: no edema    Assessment: Encounter Diagnoses  Name Primary?  . Insulin dependent diabetes mellitus Yes  . Constipation, unspecified constipation type   . Balanitis   . Hyperlipidemia   . Vaccine refused by patient     Plan: IDDM - labs today.  Continue Lantus 10 units daily at bedtime, NovoLog sliding scale, healthy diet, exercise, continue checking glucose  before meals, check feet daily, see eye doctor yearly.  Constipation-discussed diet, water intake, fiber intake, change to Linzess. Discussed risk and benefits of medication  Balanitis-over the next 2 weeks try Cortaid OTC or Aquafor in the day, ketoconazole QHS up to 2 more weeks  Hyperlipidemia - labs today, c/t same medication  He declines influenza vaccine, will consider pneumococcal vaccine, he reports being up to date on Tdap.

## 2013-12-20 LAB — CBC
HEMATOCRIT: 39.4 % (ref 39.0–52.0)
Hemoglobin: 13.2 g/dL (ref 13.0–17.0)
MCH: 28.2 pg (ref 26.0–34.0)
MCHC: 33.5 g/dL (ref 30.0–36.0)
MCV: 84.2 fL (ref 78.0–100.0)
MPV: 10.2 fL (ref 9.4–12.4)
PLATELETS: 281 10*3/uL (ref 150–400)
RBC: 4.68 MIL/uL (ref 4.22–5.81)
RDW: 13.5 % (ref 11.5–15.5)
WBC: 3.6 10*3/uL — AB (ref 4.0–10.5)

## 2013-12-20 LAB — TSH: TSH: 0.859 u[IU]/mL (ref 0.350–4.500)

## 2013-12-20 LAB — MICROALBUMIN / CREATININE URINE RATIO
Creatinine, Urine: 59.4 mg/dL
MICROALB UR: 0.3 mg/dL (ref <2.0)
Microalb Creat Ratio: 5.1 mg/g (ref 0.0–30.0)

## 2013-12-20 LAB — HEMOGLOBIN A1C
Hgb A1c MFr Bld: 7.9 % — ABNORMAL HIGH (ref <5.7)
Mean Plasma Glucose: 180 mg/dL — ABNORMAL HIGH (ref <117)

## 2013-12-20 MED ORDER — ATORVASTATIN CALCIUM 40 MG PO TABS: 40.0000 mg | ORAL_TABLET | Freq: Every day | ORAL | Status: DC

## 2013-12-20 MED ORDER — INSULIN ASPART 100 UNIT/ML ~~LOC~~ SOLN: 0.0000 [IU] | Freq: Three times a day (TID) | SUBCUTANEOUS | Status: DC

## 2013-12-20 MED ORDER — LINACLOTIDE 290 MCG PO CAPS: 290.0000 ug | ORAL_CAPSULE | Freq: Every day | ORAL | Status: DC

## 2013-12-20 MED ORDER — INSULIN GLARGINE 100 UNIT/ML ~~LOC~~ SOLN: 15.0000 [IU] | Freq: Every day | SUBCUTANEOUS | Status: DC

## 2013-12-20 NOTE — Addendum Note (Signed)
Addended by: Carlena Hurl on: 12/20/2013 06:32 AM   Modules accepted: Orders, Medications

## 2014-01-24 ENCOUNTER — Telehealth: Payer: Self-pay | Admitting: Internal Medicine

## 2014-01-24 ENCOUNTER — Other Ambulatory Visit: Payer: Self-pay | Admitting: Medical

## 2014-01-24 MED ORDER — INSULIN GLARGINE 100 UNIT/ML SOLOSTAR PEN: 15.0000 [IU] | PEN_INJECTOR | Freq: Every day | SUBCUTANEOUS | Status: DC

## 2014-01-24 MED ORDER — INSULIN ASPART 100 UNIT/ML FLEXPEN: 0.0000 [IU] | PEN_INJECTOR | Freq: Three times a day (TID) | SUBCUTANEOUS | Status: DC

## 2014-01-24 NOTE — Telephone Encounter (Signed)
Flex pens sent

## 2014-01-24 NOTE — Telephone Encounter (Signed)
Patient aware.

## 2014-01-24 NOTE — Telephone Encounter (Signed)
Pt called and states that his novolog and lantus was suppose to be the flexpens and not vials. Please send these in for the flexpens and then send in pen needles BD .74mm by 69mm to [facility]

## 2014-03-05 DIAGNOSIS — Z789 Other specified health status: Secondary | ICD-10-CM

## 2014-03-05 HISTORY — DX: Other specified health status: Z78.9

## 2014-03-12 ENCOUNTER — Encounter: Payer: Self-pay | Admitting: Medical

## 2014-03-12 ENCOUNTER — Ambulatory Visit (INDEPENDENT_AMBULATORY_CARE_PROVIDER_SITE_OTHER): Payer: Managed Care, Other (non HMO) | Admitting: Medical

## 2014-03-12 VITALS — BP 132/88 | HR 59 | Temp 97.9°F | Resp 16 | Wt 164.0 lb

## 2014-03-12 DIAGNOSIS — E785 Hyperlipidemia, unspecified: Secondary | ICD-10-CM

## 2014-03-12 DIAGNOSIS — E119 Type 2 diabetes mellitus without complications: Secondary | ICD-10-CM

## 2014-03-12 DIAGNOSIS — Z7189 Other specified counseling: Secondary | ICD-10-CM

## 2014-03-12 DIAGNOSIS — Z139 Encounter for screening, unspecified: Secondary | ICD-10-CM

## 2014-03-12 DIAGNOSIS — Z794 Long term (current) use of insulin: Secondary | ICD-10-CM | POA: Diagnosis not present

## 2014-03-12 DIAGNOSIS — IMO0001 Reserved for inherently not codable concepts without codable children: Secondary | ICD-10-CM

## 2014-03-12 DIAGNOSIS — Z7185 Encounter for immunization safety counseling: Secondary | ICD-10-CM

## 2014-03-12 LAB — LIPID PANEL
CHOL/HDL RATIO: 1.6 ratio
CHOLESTEROL: 139 mg/dL (ref 0–200)
HDL: 85 mg/dL (ref >=40)
LDL CALC: 47 mg/dL (ref 0–99)
Triglycerides: 33 mg/dL (ref <150)
VLDL: 7 mg/dL (ref 0–40)

## 2014-03-12 LAB — ALT: ALT: 24 U/L (ref 0–53)

## 2014-03-12 LAB — POCT GLYCOSYLATED HEMOGLOBIN (HGB A1C): HEMOGLOBIN A1C: 7

## 2014-03-12 MED ORDER — ASPIRIN EC 81 MG PO TBEC: 81.0000 mg | DELAYED_RELEASE_TABLET | Freq: Every day | ORAL | Status: DC

## 2014-03-12 MED ORDER — INSULIN ASPART 100 UNIT/ML FLEXPEN: 0.0000 [IU] | PEN_INJECTOR | Freq: Three times a day (TID) | SUBCUTANEOUS | Status: DC

## 2014-03-12 MED ORDER — INSULIN GLARGINE 100 UNIT/ML SOLOSTAR PEN: 15.0000 [IU] | PEN_INJECTOR | Freq: Every day | SUBCUTANEOUS | Status: DC

## 2014-03-12 MED ORDER — LISINOPRIL 10 MG PO TABS: 10.0000 mg | ORAL_TABLET | Freq: Every day | ORAL | Status: DC

## 2014-03-12 NOTE — Progress Notes (Signed)
  Subjective:   Richard Berg is an 49 y.o. male who presents for follow up of insulin dependent diabetes mellitus.   Patient is checking home blood sugars.   Home blood sugar records: 78- 167.  Other numbers: 7 day average 113, 14 day average 103, 30 day average 103, lately 90-122 fasting.  Checks before each meal.   Current symptoms include: none. Patient denies no problems.  Patient is checking their feet daily. Foot concerns (callous, ulcer, wound, thickened nails, toenail fungus, skin fungus, hammer toe): no concerns Last dilated eye exam 03/2013  Current treatments: Lantus 15 u QHS, Novolog mealtime insulin.    Medication compliance: good  Current diet: between eating good and bad Current exercise: physical on the job Known diabetic complications: none  Hyperlipidemia - at his last visit we changed from Pravachol to Lipitor.  tolerating the new medication without complaint.  The following portions of the patient's history were reviewed and updated as appropriate: allergies, current medications, past family history, past medical history, past social history, past surgical history and problem list.  ROS as in subjective above    Objective:   BP 132/88 mmHg  Pulse 59  Temp(Src) 97.9 F (36.6 C) (Oral)  Resp 16  Wt 164 lb (74.39 kg)  Gen: wdwn, lean AA male, NAD Skin: unremarkable Heart: RRR, normal s1, s2, no murmurs Lungs clear Ext: no edema Pulses normal Abdomen: +bs, soft, nontneder, no mass, no organomegaly    Assessment:   Encounter Diagnoses  Name Primary?  . Insulin dependent diabetes mellitus Yes  . Hyperlipidemia   . Constipation, unspecified constipation type   . Vaccine counseling   . Counseling on health promotion and disease prevention      Plan:   IDDM-hemoglobin A1c 7.0 % today.  C/t current regimen, Lantus 15 u QHS, meal time Novolog, health diet.   See eye doctor yearly, daily foot checks, and add Lisinopril for renal protection.  discussed risks/benefits of medication.  Hyperlipidemia-compliant with Lipitor 40 mg daily at bedtime along with baby aspirin daily, discussed diet, labs today, follow-up pending labs  Vaccine counseling-discussed the recommendations for vaccination including yearly influenza vaccine and pneumococcal vaccine, Tdap vaccine, hepatitis B vaccine.  Declines flu shot today.   Up to date on tdap.  Will consider pneumococcal vaccine.  Hep B titer today.  Discussed disease prevention in general, healthy diet and exercise

## 2014-03-12 NOTE — Patient Instructions (Addendum)
Recommendations:  continue healthy diet and current medication regimen.  Add Lisinopril once daily in the morning for kidney protection  See your eye doctor yearly for routine vision care.     Correction Insulin/Sliding Scale Your caregiver has decided you need insulin at home. You have been given a correctional scale (sliding scale) in case you need extra insulin when your blood sugar is too high (hyperglycemia). The following instructions will assist you in how to use that correctional scale.  WHAT IS A CORRECTIONAL SCALE (SLIDING SCALE)?  When you check your blood sugar, sometimes it will be higher than your caregiver wants it to be. You may need an extra dose of insulin to bring your blood sugar to your desired level (also known as your goal, target level, or normal level.) The correctional scale is prescribed by your caregiver based on your specific needs.   ______________________________________________________________________  INSULIN SLIDING SCALE   Use the chart below to determine the amount of your Novolog Insulin that you will use to control your meal time blood sugar.  If your glucose before meal is less than 60, drink 4 oz of orange juice or if able, eat a piece of candy and do not use the meal time dose of insulin  If your glucose before meal is 60 -120, don't use the meal time insulin for this meal If your glucose before meal is 121-150, use 1  unit of insulin If your glucose before meal is 151-200, use  2  units of Insulin  If your glucose before meal is 201-250, use  3  units of Insulin If your glucose before meal is 251-300, use  5  units of Insulin If your glucose before meal is 301-350, use  7 units of Insulin If your glucose before meal is 351-400, use  9  units of Insulin If your glucose before meal is 451-500, use  11  units of Insulin If your glucose before meal is >500, use 14 units of Insulin and call doctor  immediately  ________________________________________________________________________    WHY IS IT IMPORTANT TO KEEP YOUR BLOOD SUGAR LEVELS AT YOUR DESIRED LEVEL?  It helps to prevent long-term complications of diabetes, such as eye disease, kidney failure, and other serious complications. WHAT TYPE OF INSULIN WILL YOU USE?  To help bring down blood sugars that are too high, your caregiver has prescribed a short-acting or a rapid-acting insulin. An example of a short-acting insulin would be Regular.  WHAT DO I NEED TO DO?   Check your blood sugar with your home blood glucose meter as recommended by your caregiver.  Using your correctional scale, find the range your blood sugar lies in.  Look for the units of insulin that matches the blood sugar range. Give yourself the dose of correctional insulin your caregiver has prescribed. Always make sure you are using the right type of insulin.  Prior to the injection make sure you have food available that you can eat in the next 15 to 30 minutes.  If your correctional insulin is rapid acting, start eating your meal within 15 minutes after you have given yourself the insulin injection. If you wait longer than 15 minutes to eat, your blood sugar might get too low.  If your correctional insulin is short acting (Regular), start eating your meal within 30 minutes after you have given yourself the insulin injection. If you wait longer than 30 minutes to eat, your blood sugar might get too low. Symptoms of low blood sugar (  hypoglycemia) may include feeling shaky or weak, sweating a lot, not thinking straight, difficulty seeing, agitation, or crankiness. Check your blood sugar immediately and treat your results as directed by your caregiver.  Keep a log of your blood sugar results with the time you took the test and the amount of insulin that you injected. This information will help your caregiver manage your medications.  Note on your log anything  that may affect your blood sugars such as:  Changes in normal exercise or activity.  Changes in your normal schedule, such as staying up late, going on vacation, changing your diet, or holidays.  New medications. This includes all medications. Some medications, even those that do not require a prescription, may cause high blood sugars.  Illness or stress.  Changes in when you actually took your medication.  Changes in your meals, such as skipping a meal, a late meal, or dining out.  Eating things that may affect blood glucose, such as snacks, larger meal portions than normal, or drinks with sugar.  Ask your caregiver any questions you have.

## 2014-03-13 LAB — INSULIN, RANDOM: Insulin: 11.4 u[IU]/mL (ref 2.0–19.6)

## 2014-03-13 LAB — HEPATITIS B SURFACE ANTIBODY, QUANTITATIVE: Hepatitis B-Post: 1000 m[IU]/mL

## 2014-03-27 ENCOUNTER — Other Ambulatory Visit: Payer: Self-pay | Admitting: Medical

## 2014-04-16 ENCOUNTER — Ambulatory Visit (INDEPENDENT_AMBULATORY_CARE_PROVIDER_SITE_OTHER): Payer: Managed Care, Other (non HMO) | Admitting: Medical

## 2014-04-16 ENCOUNTER — Encounter: Payer: Self-pay | Admitting: Medical

## 2014-04-16 VITALS — BP 100/80 | HR 86 | Resp 16 | Wt 169.0 lb

## 2014-04-16 DIAGNOSIS — M545 Low back pain, unspecified: Secondary | ICD-10-CM

## 2014-04-16 DIAGNOSIS — K4090 Unilateral inguinal hernia, without obstruction or gangrene, not specified as recurrent: Secondary | ICD-10-CM | POA: Diagnosis not present

## 2014-04-16 DIAGNOSIS — R10813 Right lower quadrant abdominal tenderness: Secondary | ICD-10-CM

## 2014-04-16 NOTE — Progress Notes (Signed)
Subjective: Here for pain.  He has hx/o constipation, diabetes on insulin.   He notes some recent pains that he wanted to check out.    Over the past week has had both left low back pain and right inguinal discomfort.   He denies specific fall or injury.  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 These are normally removed with a hand truck but they still had to be manipulated and transported with the hand truck which he does.  Although he does this regularly on the job he doesn't recall any specific injury or trauma that may have instigated the back pain.  The pain was worse a few days ago but currently has no pain in his left low back.  Intermittently through the last several days he also has had some right inguinal discomfort, with necessarily classify it as a pain.  He does recall [place] discussing a small inguinal hernia found on his physical back a few months ago. Was worried that this could be related to the hernia.  He denies abdominal pain otherwise, no nausea no vomiting no diarrhea not gassy no bloating.  His bowel movement this morning was a little strained but otherwise has had 2 additional bowel movements today were normal and in general has a daily bowel movement without complaint since taking the Linzess for constipation.  Denies any recent urinary issue, no frequency no urgency no blood noted burning with urination noted scrotal tenderness or swelling. No penile discharge.denies any bulging in the inguinal area. No respiratory complaint no chest pain or shortness of breath. No leg pain no numbness or tingling in the extremities.  Similar to his last visit his glucose readings have been pretty stable and under control with his current regimen  Past Medical History  Diagnosis Date  . Positive TB test     "took the treatment ~ 1993"  . Diabetes mellitus     dx'd 02/15/11   . Hyperlipidemia   . Constipation    Past Surgical History  Procedure Laterality Date  . No past surgeries     Review of systems as in subjective  Objective: BP 100/80 mmHg  Pulse 86  Resp 16  Wt 169 lb (76.658 kg)  General appearance: alert, no distress, WD/WN, lean AA male Heart: RRR, normal S1, S2, no murmurs Lungs: CTA bilaterally, no wheezes, rhonchi, or rales Abdomen: +bs, soft, non tender, non distended, no masses, no hepatomegaly, no splenomegaly Back: non tender, Normal range of motion, no deformity, no obvious scoliosis Musculoskeletal: legs nontender, no swelling, no obvious deformity Extremities: no edema, no cyanosis, no clubbing Pulses: 2+ symmetric, upper and lower extremities, normal cap refill Neurological: gait normal, legs normal strength sensation and DTRs GU: Normal male genitalia, circumcised, testicles nontender, no swelling no mass, no lymphadenopathy, no change in very small right direct inguinal hernia that is reducible and nontender    Assessment: Encounter Diagnoses  Name Primary?  . Left-sided low back pain without sciatica Yes  . Right lower quadrant abdominal tenderness   . Right inguinal hernia     Plan: Other than the small nontender reducible right inguinal hernia, no other abnormal exam findings today.  Given his occupation and moving the heavy gas cylinders on the job occasionally, and giving the fact that he is not exercising much of late and has a  sit down office job,I suspect he had a recent left low back strain that seems to have resolved.  No worrisome findings today with suspicion of anything else going on that needs further study today.  I recommended he get on a routine daily regimen of stretching and exercise, discussed core and back strengthening exercises,discussed proper lifting, short-term can use heat or over-the-counter NSAID or Tylenol for a few days if needed for back pain if it recurs.    We discussed the small right  inguinal hernia that doesn't seem to be a issue right now. Discussed measures to strengthen the abdomen wall and preventing hernia from getting worse. Discussed symptoms or findings that would prompt more urgent recheck on hernia.    Continue Linzess for constipation which seems to be working fine, follow-up otherwise when necessary

## 2014-05-29 ENCOUNTER — Telehealth: Payer: Self-pay | Admitting: Medical

## 2014-05-29 ENCOUNTER — Other Ambulatory Visit: Payer: Self-pay | Admitting: Family Medicine

## 2014-05-29 MED ORDER — LISINOPRIL 10 MG PO TABS: 10.0000 mg | ORAL_TABLET | Freq: Every day | ORAL | Status: DC

## 2014-05-29 NOTE — Telephone Encounter (Signed)
I sent 90 days supply to his pharmacy

## 2014-05-29 NOTE — Telephone Encounter (Signed)
Pharmacy sent 90 day supply request for lisinopril 10mg  to [facility] [address] [phone]

## 2014-06-26 ENCOUNTER — Other Ambulatory Visit: Payer: Self-pay | Admitting: Medical

## 2014-06-26 ENCOUNTER — Telehealth: Payer: Self-pay | Admitting: Medical

## 2014-06-26 NOTE — Telephone Encounter (Signed)
Pt needs refill on Atorvastatin for 90 days

## 2014-07-16 ENCOUNTER — Encounter: Payer: Self-pay | Admitting: Medical

## 2014-07-16 ENCOUNTER — Ambulatory Visit (INDEPENDENT_AMBULATORY_CARE_PROVIDER_SITE_OTHER): Payer: Managed Care, Other (non HMO) | Admitting: Medical

## 2014-07-16 VITALS — BP 120/80 | HR 85 | Resp 15 | Wt 163.0 lb

## 2014-07-16 DIAGNOSIS — E119 Type 2 diabetes mellitus without complications: Secondary | ICD-10-CM

## 2014-07-16 DIAGNOSIS — E785 Hyperlipidemia, unspecified: Secondary | ICD-10-CM | POA: Insufficient documentation

## 2014-07-16 DIAGNOSIS — K5909 Other constipation: Secondary | ICD-10-CM

## 2014-07-16 DIAGNOSIS — Z794 Long term (current) use of insulin: Secondary | ICD-10-CM | POA: Diagnosis not present

## 2014-07-16 DIAGNOSIS — Z23 Encounter for immunization: Secondary | ICD-10-CM

## 2014-07-16 DIAGNOSIS — Z789 Other specified health status: Secondary | ICD-10-CM

## 2014-07-16 DIAGNOSIS — IMO0001 Reserved for inherently not codable concepts without codable children: Secondary | ICD-10-CM

## 2014-07-16 LAB — COMPREHENSIVE METABOLIC PANEL
ALBUMIN: 4.6 g/dL (ref 3.5–5.2)
ALK PHOS: 68 U/L (ref 39–117)
ALT: 21 U/L (ref 0–53)
AST: 20 U/L (ref 0–37)
BILIRUBIN TOTAL: 0.5 mg/dL (ref 0.2–1.2)
BUN: 17 mg/dL (ref 6–23)
CHLORIDE: 105 meq/L (ref 96–112)
CO2: 23 meq/L (ref 19–32)
Calcium: 9.6 mg/dL (ref 8.4–10.5)
Creat: 0.89 mg/dL (ref 0.50–1.35)
GLUCOSE: 67 mg/dL — AB (ref 70–99)
POTASSIUM: 4.3 meq/L (ref 3.5–5.3)
SODIUM: 137 meq/L (ref 135–145)
Total Protein: 7 g/dL (ref 6.0–8.3)

## 2014-07-16 MED ORDER — LISINOPRIL 10 MG PO TABS: 10.0000 mg | ORAL_TABLET | Freq: Every day | ORAL | Status: DC

## 2014-07-16 MED ORDER — HYDROCORTISONE 2.5 % RE CREA: 1.0000 "application " | TOPICAL_CREAM | Freq: Two times a day (BID) | RECTAL | Status: DC

## 2014-07-16 MED ORDER — ATORVASTATIN CALCIUM 40 MG PO TABS: 40.0000 mg | ORAL_TABLET | Freq: Every day | ORAL | Status: DC

## 2014-07-16 MED ORDER — LINACLOTIDE 290 MCG PO CAPS: 290.0000 ug | ORAL_CAPSULE | Freq: Every day | ORAL | Status: DC

## 2014-07-16 MED ORDER — INSULIN ASPART 100 UNIT/ML FLEXPEN: 0.0000 [IU] | PEN_INJECTOR | Freq: Three times a day (TID) | SUBCUTANEOUS | Status: DC

## 2014-07-16 MED ORDER — INSULIN GLARGINE 100 UNIT/ML SOLOSTAR PEN: 15.0000 [IU] | PEN_INJECTOR | Freq: Every day | SUBCUTANEOUS | Status: DC

## 2014-07-16 NOTE — Progress Notes (Signed)
  Subjective:   Richard Berg is an 49 y.o. male who presents for follow up of Type 2 diabetes mellitus, and med check.   Last visit 04/2014 for same.    Patient is checking home blood sugars.   Home blood sugar records: 80 to 120 but no higher than 150 Current symptoms include: none. Patient denies no concerns.  Patient is checking their feet daily. Foot concerns (callous, ulcer, wound, thickened nails, toenail fungus, skin fungus, hammer toe): no concerns Last dilated eye exam eye exam 07/30/14  Current treatments:  Lantus 15 u QHS and Novolog sliding scale Medication compliance: good  Current diet: well balanced Current exercise: none Known diabetic complications: none  Compliant with aspirin, lipitor and started Lisionpirl last viist.   No medication side effects.   The following portions of the patient's history were reviewed and updated as appropriate: allergies, current medications, past family history, past medical history, past social history, past surgical history and problem list.  ROS as in subjective above    Objective:   BP 120/80 mmHg  Pulse 85  Resp 15  Wt 163 lb (73.936 kg)  Gen: wdwn, lean AA male, NAD Skin: unremarkable Heart: RRR, normal s1, s2, no murmurs Lungs clear Ext: no edema Pulses normal Abdomen: +bs, soft, nontneder, no mass, no organomegaly  Assessment:   Encounter Diagnoses  Name Primary?  . Insulin dependent diabetes mellitus Yes  . Hyperlipidemia   . Other constipation   . Need for prophylactic vaccination against Streptococcus pneumoniae (pneumococcus)   . Hepatitis B immune      Plan:   Diabetes Mellitus type 2: Education: Reviewed 'ABCs' of diabetes management (respective goals in parentheses):  A1C (<7), blood pressure (<130/80), and cholesterol (LDL <100)   questionable DM 1 vs II since recent insulin level was not abnoraml suggesting he is stil making some insulin, but under good control   Compliance at present is  estimated to be excellent.   Blood pressure: normal blood pressure .   An ACE/ARB is currently part of their treatment regimen.   Dyslipidemia under excellent control. .  A statin is currently part of their treatment regimen.   Discussed general issues about diabetes pathophysiology and management. Counseling at today's visit: reminded to check sugars regularly and to bring readings in at the time of the next visit. Addressed ADA diet. Suggested low cholesterol diet. Encouraged aerobic exercise. Discussed foot care. Reminded to get yearly retinal exam. Discussed ways to avoid symptomatic hypoglycemia.    Strongly encouraged pneumococcal vaccine.  He will consider at let me know next visit.  Advised  Yearly flu shot  Follow up: pending labs

## 2014-07-16 NOTE — Patient Instructions (Signed)
Recommendations  I really want you to get the Pneumococcal 23 vaccine  We will request PSA prostate lab results from the last year or 2  continue all medications as usual  Check feet daily for wounds/sores  Continue health diabetic diet and exercise  Check sugars every morning fasting and as long as the glucose is staying under 130, then just check glucose at other times periodically but check every day in the morning  Continue Lantus 15 units at night, Novolog mealtime as needed

## 2014-07-17 LAB — HEMOGLOBIN A1C
HEMOGLOBIN A1C: 7.3 % — AB (ref <5.7)
MEAN PLASMA GLUCOSE: 163 mg/dL — AB (ref <117)

## 2014-07-25 ENCOUNTER — Telehealth: Payer: Self-pay | Admitting: Internal Medicine

## 2014-07-25 NOTE — Telephone Encounter (Signed)
Pt had a reaction to lipitor with top and bottom lip swelling and had to go to ER and they told him to stop meds. Please advise and [facility] a [facility] [address]. i have put lipitor in allergies

## 2014-07-25 NOTE — Telephone Encounter (Signed)
Sorry to hear about this, as this is always a possibility with a medication.  Which ED did he go to?  However, but did he mean Lisinopril, not Lipitor?    Lisinopril has the potential to causes angioedema, but Lipitor should not.

## 2014-07-25 NOTE — Telephone Encounter (Signed)
When I called patient he was busy, and couldn't provide much information but he was on vacation in [facility] and he will fax the ER records tomorrow for you to review.

## 2014-07-31 ENCOUNTER — Telehealth: Payer: Self-pay | Admitting: Medical

## 2014-07-31 ENCOUNTER — Other Ambulatory Visit: Payer: Self-pay | Admitting: Medical

## 2014-07-31 NOTE — Telephone Encounter (Signed)
pls let him know that I received the ED report from [facility].   Make sure he has stopped Lisinopril which we were using to protect the kidneys, and continue Lipitor which is for cholesterol.  The Lipitor is not the problem, it was the Lisinopril.

## 2014-07-31 NOTE — Telephone Encounter (Signed)
LMOM TO CB. CLS 

## 2014-08-01 NOTE — Telephone Encounter (Signed)
Confirmed all with patient. He is not taking lisinopril and is taking lipitor.

## 2015-08-09 ENCOUNTER — Other Ambulatory Visit: Payer: Self-pay | Admitting: Medical

## 2015-08-13 ENCOUNTER — Encounter: Payer: Self-pay | Admitting: Medical

## 2015-08-13 ENCOUNTER — Ambulatory Visit (INDEPENDENT_AMBULATORY_CARE_PROVIDER_SITE_OTHER): Payer: Managed Care, Other (non HMO) | Admitting: Medical

## 2015-08-13 VITALS — BP 120/70 | HR 70 | Resp 16 | Ht 69.0 in | Wt 153.6 lb

## 2015-08-13 DIAGNOSIS — Z7189 Other specified counseling: Secondary | ICD-10-CM | POA: Diagnosis not present

## 2015-08-13 DIAGNOSIS — Z794 Long term (current) use of insulin: Secondary | ICD-10-CM

## 2015-08-13 DIAGNOSIS — M79672 Pain in left foot: Secondary | ICD-10-CM | POA: Insufficient documentation

## 2015-08-13 DIAGNOSIS — IMO0001 Reserved for inherently not codable concepts without codable children: Secondary | ICD-10-CM

## 2015-08-13 DIAGNOSIS — M6740 Ganglion, unspecified site: Secondary | ICD-10-CM

## 2015-08-13 DIAGNOSIS — Z7185 Encounter for immunization safety counseling: Secondary | ICD-10-CM

## 2015-08-13 DIAGNOSIS — M779 Enthesopathy, unspecified: Secondary | ICD-10-CM | POA: Diagnosis not present

## 2015-08-13 DIAGNOSIS — E785 Hyperlipidemia, unspecified: Secondary | ICD-10-CM

## 2015-08-13 DIAGNOSIS — E119 Type 2 diabetes mellitus without complications: Secondary | ICD-10-CM | POA: Diagnosis not present

## 2015-08-13 DIAGNOSIS — K5909 Other constipation: Secondary | ICD-10-CM

## 2015-08-13 DIAGNOSIS — M67431 Ganglion, right wrist: Secondary | ICD-10-CM | POA: Insufficient documentation

## 2015-08-13 LAB — CBC
HCT: 39.4 % (ref 38.5–50.0)
HEMOGLOBIN: 13.6 g/dL (ref 13.2–17.1)
MCH: 29.8 pg (ref 27.0–33.0)
MCHC: 34.5 g/dL (ref 32.0–36.0)
MCV: 86.4 fL (ref 80.0–100.0)
MPV: 9.9 fL (ref 7.5–12.5)
PLATELETS: 250 10*3/uL (ref 140–400)
RBC: 4.56 MIL/uL (ref 4.20–5.80)
RDW: 13.4 % (ref 11.0–15.0)
WBC: 4.5 10*3/uL (ref 4.0–10.5)

## 2015-08-13 MED ORDER — LINACLOTIDE 290 MCG PO CAPS: 290.0000 ug | ORAL_CAPSULE | Freq: Every day | ORAL | 3 refills | Status: DC

## 2015-08-13 MED ORDER — ASPIRIN EC 81 MG PO TBEC: 81.0000 mg | DELAYED_RELEASE_TABLET | Freq: Every day | ORAL | 3 refills | Status: DC

## 2015-08-13 NOTE — Addendum Note (Signed)
Addended by: Carlena Hurl on: 08/13/2015 11:06 PM   Modules accepted: Orders

## 2015-08-13 NOTE — Progress Notes (Addendum)
Subjective:   Richard Berg is an 50 y.o. male who presents for follow up of Type 2 diabetes mellitus, and med check.   Last visit 04/2014 for same.   Chief Complaint  Patient presents with  . Medication Management    needs refill on all medications. DM- sugars running no higher than 120. Rt hand knot. painful to touch   Diabetes - Patient is checking home blood sugars.   Home blood sugar records: 120-130s Current symptoms include: none. Patient denies no concerns.  Patient is checking their feet daily. Foot concerns (callous, ulcer, wound, thickened nails, toenail fungus, skin fungus, hammer toe): tender lump/nodule in left sole of foot Last dilated eye exam > 1 year ago Current treatments:  Lantus 15 u QHS and Novolog sliding scale, on average 2 units each meal Medication compliance: good  Current diet: tries to eat healthy Current exercise: walks a lot at work Known diabetic complications: none  He declines pneumococcal vaccine  Constipation - not a big issue of late, uses linzess prn, not regularly  Compliant with aspirin, Lipitor last visit.   No medication side effects.   The following portions of the patient's history were reviewed and updated as appropriate: allergies, current medications, past family history, past medical history, past social history, past surgical history and problem list.  ROS as in subjective above  Past Medical History:  Diagnosis Date  . Constipation   . Diabetes mellitus    dx'd 02/15/11  . Family history of premature CAD    father MI in early 13s  . Hepatitis B immune 03/2014   per titer  . Hyperlipidemia   . Positive TB test    "took the treatment ~ 1993"   Current Outpatient Prescriptions on File Prior to Visit  Medication Sig Dispense Refill  . atorvastatin (LIPITOR) 40 MG tablet Take 1 tablet (40 mg total) by mouth daily at 6 PM. 90 tablet 3  . hydrocortisone (ANUSOL-HC) 2.5 % rectal cream Place 1 application rectally 2 (two)  times daily. 30 g 2  . insulin aspart (NOVOLOG FLEXPEN) 100 UNIT/ML FlexPen Inject 0-15 Units into the skin 3 (three) times daily with meals. 15 mL 5  . Insulin Glargine (LANTUS SOLOSTAR) 100 UNIT/ML Solostar Pen Inject 15 Units into the skin daily at 10 pm. 5 pen 5  . cyclobenzaprine (FLEXERIL) 10 MG tablet Take 5-10 mg by mouth 3 (three) times daily as needed. For muscle spasms    . ketoconazole (NIZORAL) 2 % cream Apply 1 application topically daily. (Patient not taking: Reported on 08/13/2015) 15 g 0   No current facility-administered medications on file prior to visit.       Objective:   BP 120/70   Pulse 70   Resp 16   Ht 5\' 9"  (1.753 m)   Wt 153 lb 9.6 oz (69.7 kg)   BMI 22.68 kg/m   Gen: wdwn, lean AA male, NAD Skin: unremarkable Heart: RRR, normal s1, s2, no murmurs Lungs clear Ext: no edema Pulses normal Abdomen: +bs, soft, nontender, no mass, no organomegaly Right hand dorsal wrist with 1.5 cm diameter well defined mobile mass c/t ganglion cyst  Diabetic Foot Exam - Simple   Simple Foot Form Diabetic Foot exam was performed with the following findings:  Yes 08/13/2015 10:39 PM  Visual Inspection See comments:  Yes Sensation Testing Intact to touch and monofilament testing bilaterally:  Yes Pulse Check See comments:  Yes Comments 1+ pedal pulses, left foot tender nodule along distal  5th metatarsal, otherwise no mass, nontender, no deformity      Assessment:   Encounter Diagnoses  Name Primary?  . Insulin dependent diabetes mellitus ([place]) Yes  . Hyperlipidemia   . Vaccine counseling   . Other constipation   . Ganglion cyst   . Bone spur   . Foot pain, left      Plan:   Diabetes Mellitus type 2: Education: Reviewed 'ABCs' of diabetes management (respective goals in parentheses):  A1C (<7), blood pressure (<130/80), and cholesterol (LDL <100)  labs today, c/t same medication.    discusesd diet, exercise, glucose monitoring.  Advised f/u q64mo, not  yearly.     Strongly encouraged pneumococcal vaccine.   Advised yearly flu shot  Foot pain, findings suggest bone spur.   He has recently gotten new boots.  He will wait and see how the symptoms do for the next few weeks.  If not improving then referral to podiatry.  Ganglion cyst - reassured.   If it gets worse or painful, let us know and we can refer to ortho  Follow up: pending labs  Richard Berg was seen today for medication management.  Diagnoses and all orders for this visit:  Insulin dependent diabetes mellitus ([place]) -     Comprehensive metabolic panel -     CBC -     Lipid panel -     Hemoglobin A1c -     HM DIABETES EYE EXAM -     HM DIABETES FOOT EXAM -     Cancel: Microalbumin / creatinine urine ratio  Hyperlipidemia -     Comprehensive metabolic panel -     CBC -     Lipid panel -     Hemoglobin A1c -     HM DIABETES EYE EXAM -     HM DIABETES FOOT EXAM -     Cancel: Microalbumin / creatinine urine ratio  Vaccine counseling  Other constipation  Ganglion cyst  Bone spur  Foot pain, left  Other orders -     aspirin EC 81 MG tablet; Take 1 tablet (81 mg total) by mouth daily. -     linaclotide (LINZESS) 290 MCG CAPS capsule; Take 1 capsule (290 mcg total) by mouth daily.

## 2015-08-14 ENCOUNTER — Other Ambulatory Visit: Payer: Self-pay | Admitting: Medical

## 2015-08-14 LAB — COMPREHENSIVE METABOLIC PANEL
ALBUMIN: 4.6 g/dL (ref 3.6–5.1)
ALT: 15 U/L (ref 9–46)
AST: 14 U/L (ref 10–40)
Alkaline Phosphatase: 68 U/L (ref 40–115)
BUN: 12 mg/dL (ref 7–25)
CALCIUM: 9.7 mg/dL (ref 8.6–10.3)
CHLORIDE: 106 mmol/L (ref 98–110)
CO2: 24 mmol/L (ref 20–31)
Creat: 0.85 mg/dL (ref 0.60–1.35)
Glucose, Bld: 106 mg/dL — ABNORMAL HIGH (ref 65–99)
Potassium: 4.2 mmol/L (ref 3.5–5.3)
SODIUM: 139 mmol/L (ref 135–146)
TOTAL PROTEIN: 7 g/dL (ref 6.1–8.1)
Total Bilirubin: 0.4 mg/dL (ref 0.2–1.2)

## 2015-08-14 LAB — LIPID PANEL
CHOLESTEROL: 180 mg/dL (ref 125–200)
HDL: 78 mg/dL (ref >=40)
LDL Cholesterol: 92 mg/dL (ref <130)
TRIGLYCERIDES: 49 mg/dL (ref <150)
Total CHOL/HDL Ratio: 2.3 Ratio (ref <=5.0)
VLDL: 10 mg/dL (ref <30)

## 2015-08-14 LAB — HEMOGLOBIN A1C
Hgb A1c MFr Bld: 6.5 % — ABNORMAL HIGH (ref <5.7)
Mean Plasma Glucose: 140 mg/dL

## 2015-08-14 MED ORDER — ATORVASTATIN CALCIUM 40 MG PO TABS: 40.0000 mg | ORAL_TABLET | Freq: Every day | ORAL | 3 refills | Status: DC

## 2015-08-14 MED ORDER — INSULIN GLARGINE 100 UNIT/ML SOLOSTAR PEN: 15.0000 [IU] | PEN_INJECTOR | Freq: Every day | SUBCUTANEOUS | 5 refills | Status: DC

## 2015-08-14 MED ORDER — INSULIN ASPART 100 UNIT/ML FLEXPEN: 0.0000 [IU] | PEN_INJECTOR | Freq: Three times a day (TID) | SUBCUTANEOUS | 3 refills | Status: DC

## 2015-08-25 ENCOUNTER — Telehealth: Payer: Self-pay

## 2015-08-25 MED ORDER — INSULIN GLARGINE 100 UNIT/ML SOLOSTAR PEN: 15.0000 [IU] | PEN_INJECTOR | Freq: Every day | SUBCUTANEOUS | 5 refills | Status: DC

## 2015-08-25 NOTE — Telephone Encounter (Signed)
[facility] to include Diagnosis and code. Script faxed. Richard Berg

## 2015-08-26 ENCOUNTER — Telehealth: Payer: Self-pay

## 2015-08-26 ENCOUNTER — Other Ambulatory Visit: Payer: Self-pay

## 2015-08-26 MED ORDER — INSULIN DETEMIR 100 UNIT/ML FLEXPEN: 15.0000 [IU] | PEN_INJECTOR | Freq: Every day | SUBCUTANEOUS | 1 refills | Status: DC

## 2015-08-26 NOTE — Telephone Encounter (Signed)
Richard Berg I have sent the levemir

## 2015-08-26 NOTE — Telephone Encounter (Signed)
I have sent in levemir

## 2015-08-26 NOTE — Telephone Encounter (Signed)
He can be switched to a: Dosing of Levemir

## 2015-08-26 NOTE — Telephone Encounter (Signed)
P.A. Request rcvd for Lantus- [place] obtained and denied for Lantus Solostar.  They request formulary trial of Levemir, Basaglar, or [place].  Please advise if you think it is necessary to switch medication or try for formulary exception. Looks like he has been on Lantus for some time.   Richard p.t.    Richard Berg December

## 2015-08-28 NOTE — Telephone Encounter (Signed)
Pt informed

## 2016-01-20 ENCOUNTER — Encounter: Payer: Self-pay | Admitting: Medical

## 2016-01-20 ENCOUNTER — Ambulatory Visit (INDEPENDENT_AMBULATORY_CARE_PROVIDER_SITE_OTHER): Payer: Managed Care, Other (non HMO) | Admitting: Medical

## 2016-01-20 VITALS — BP 130/80 | HR 88 | Wt 152.2 lb

## 2016-01-20 DIAGNOSIS — N401 Enlarged prostate with lower urinary tract symptoms: Secondary | ICD-10-CM

## 2016-01-20 DIAGNOSIS — E785 Hyperlipidemia, unspecified: Secondary | ICD-10-CM | POA: Diagnosis not present

## 2016-01-20 DIAGNOSIS — R142 Eructation: Secondary | ICD-10-CM | POA: Diagnosis not present

## 2016-01-20 DIAGNOSIS — R109 Unspecified abdominal pain: Secondary | ICD-10-CM | POA: Insufficient documentation

## 2016-01-20 DIAGNOSIS — IMO0001 Reserved for inherently not codable concepts without codable children: Secondary | ICD-10-CM

## 2016-01-20 DIAGNOSIS — Z566 Other physical and mental strain related to work: Secondary | ICD-10-CM | POA: Insufficient documentation

## 2016-01-20 DIAGNOSIS — E119 Type 2 diabetes mellitus without complications: Secondary | ICD-10-CM | POA: Diagnosis not present

## 2016-01-20 DIAGNOSIS — N529 Male erectile dysfunction, unspecified: Secondary | ICD-10-CM | POA: Insufficient documentation

## 2016-01-20 DIAGNOSIS — Z794 Long term (current) use of insulin: Secondary | ICD-10-CM

## 2016-01-20 LAB — CBC
HEMATOCRIT: 43 % (ref 38.5–50.0)
HEMOGLOBIN: 14.6 g/dL (ref 13.2–17.1)
MCH: 30.1 pg (ref 27.0–33.0)
MCHC: 34 g/dL (ref 32.0–36.0)
MCV: 88.7 fL (ref 80.0–100.0)
MPV: 9.8 fL (ref 7.5–12.5)
Platelets: 243 10*3/uL (ref 140–400)
RBC: 4.85 MIL/uL (ref 4.20–5.80)
RDW: 13.3 % (ref 11.0–15.0)
WBC: 3.7 10*3/uL — ABNORMAL LOW (ref 4.0–10.5)

## 2016-01-20 LAB — POCT URINALYSIS DIPSTICK
Bilirubin, UA: NEGATIVE
Blood, UA: NEGATIVE
Glucose, UA: NEGATIVE
Ketones, UA: NEGATIVE
LEUKOCYTES UA: NEGATIVE
NITRITE UA: NEGATIVE
PROTEIN UA: NEGATIVE
Spec Grav, UA: >=1.03
UROBILINOGEN UA: NEGATIVE
pH, UA: 6

## 2016-01-20 LAB — PSA: PSA: 0.4 ng/mL (ref <=4.0)

## 2016-01-20 MED ORDER — OMEPRAZOLE 40 MG PO CPDR: 40.0000 mg | DELAYED_RELEASE_CAPSULE | Freq: Every day | ORAL | 0 refills | Status: DC

## 2016-01-20 NOTE — Progress Notes (Signed)
Subjective: Chief Complaint  Patient presents with  . sharp stomach pain    sharp stomach pain , and pain in gorin area. x2 weeks    Here for some abdominal pains, chest pains, mostly left sided under rib cage.   Been having pains for a few weeks.  At first thought it was indigestion but sometimes when he eats, feels it more.  Has had lots of belching.  Occurs intermittent throughout the day, but in last few days more painful.   Even when hungry has pain.  Has been eating some spicy foods, but no big portions.  Not eating 3 square meals a day.  Drinks 1-2 shots most days per week.      No fever, no weight loss.   No diarrhea.   Has had hx/o constipation, not as regular as he should be, but is having on average 1 daily BM.  Sometimes in the morning has urge to defecate, but nothing happens.   But after a while, gets the urge and BM occurs.     lately has some pressure with urination, not slower.  Has lower abdominal pressure.  Has been feeling this for weeks.  No nocturia.  Stream is a little weaker than in the past.    No SOB, pain in abdomen and chest not worse with exercise.  Using nothing for the symptoms.    Richard Berg can give him severe heartburn.  Does eat a fair amount of spicy foods and hot sauce.  Doesn't use NSAIDs regularly.   He has had colonoscopy with Dr. Benson [place] maybe 2013, thinks it was normal.  No prior EGD.   Has had some stress, 2017 was stressful.  Has problems getting and erections.  2017 work stress has been difficult. Thinks his problems with erections can be stress related but also related to his health issues.   He sometimes gets ok erections, sometimes not.  No prior use of Viagra or similar.   No other aggravating or relieving factors. No other complaint.   Past Medical History:  Diagnosis Date  . Constipation   . Diabetes mellitus    dx'd 02/15/11  . Family history of premature CAD    father MI in early 53s  . Hepatitis B immune 03/2014   per titer  . Hyperlipidemia    . Positive TB test    "took the treatment ~ 1993"   Current Outpatient Prescriptions on File Prior to Visit  Medication Sig Dispense Refill  . aspirin EC 81 MG tablet Take 1 tablet (81 mg total) by mouth daily. 90 tablet 3  . atorvastatin (LIPITOR) 40 MG tablet Take 1 tablet (40 mg total) by mouth daily at 6 PM. 90 tablet 3  . insulin aspart (NOVOLOG FLEXPEN) 100 UNIT/ML FlexPen Inject 0-15 Units into the skin 3 (three) times daily with meals. 15 mL 3  . Insulin Detemir (LEVEMIR) 100 UNIT/ML Pen Inject 15 Units into the skin daily at 10 pm. 5 pen 1  . ketoconazole (NIZORAL) 2 % cream Apply 1 application topically daily. (Patient not taking: Reported on 01/20/2016) 15 g 0  . linaclotide (LINZESS) 290 MCG CAPS capsule Take 1 capsule (290 mcg total) by mouth daily. (Patient not taking: Reported on 01/20/2016) 90 capsule 3   No current facility-administered medications on file prior to visit.    Past Surgical History:  Procedure Laterality Date  . NO PAST SURGERIES       Objective: BP 130/80   Pulse 88   Wt  152 lb 3.2 oz (69 kg)   SpO2 99%   BMI 22.48 kg/m   Wt Readings from Last 3 Encounters:  01/20/16 152 lb 3.2 oz (69 kg)  08/13/15 153 lb 9.6 oz (69.7 kg)  07/16/14 163 lb (73.9 kg)   General appearance: alert, no distress, WD/WN, lean AA male oral cavity: MMM, no lesions Neck: supple, no lymphadenopathy, no thyromegaly, no masses Heart: RRR, normal S1, S2, no murmurs Lungs: CTA bilaterally, no wheezes, rhonchi, or rales Abdomen: +bs, soft, non tender, non distended, no masses, no hepatomegaly, no splenomegaly Extremities: no edema, no cyanosis, no clubbing Pulses: 2+ symmetric, upper and lower extremities, normal cap refill Neurological: alert, oriented x 3, CN2-12 intact, strength normal upper extremities and lower extremities, sensation normal throughout, DTRs 2+ throughout, no cerebellar signs, gait normal Psychiatric: normal affect, behavior normal, pleasant  GU: normal  male, circumcised, no nodule or mass, nonnteder, no hernia DRE: 2 moderate external hemorrhoids, prostate enlarged asymmetric right side larger, but no nodules,not boggy   Adult ECG Report  Indication: ED, chest and abdominal pain  Rate: 81 bpm  Rhythm: normal sinus rhythm and sinus arrhythmia  QRS Axis: -1 degree  PR Interval: 126ms  QRS Duration: 26ms  QTc: 463ms  Conduction Disturbances: none  Other Abnormalities: none  Patient's cardiac risk factors are: diabetes mellitus and male gender.  EKG comparison: none  Narrative Interpretation: normal EKG     Assessment: Encounter Diagnoses  Name Primary?  . Abdominal pain, unspecified abdominal location Yes  . Insulin dependent diabetes mellitus ([place])   . Stomach pain   . Hyperlipidemia, unspecified hyperlipidemia type   . Belching   . Abdominal pressure   . Benign prostatic hyperplasia with lower urinary tract symptoms, symptom details unspecified   . Work stress   . Erectile dysfunction, unspecified erectile dysfunction type     Plan: Abdominal pain - likely gastritis vs ulcer.  Begin samples of Dexilan 60mg  x 5 days, then use Omeprazole sent to pharmacy, cut back on alcohol, limit spicy and acidify foods.    If not much improved in 2 wk, then recheck.  Low threshold for EGD.   Further eval with labs today  IDDM - c/t same medication  Abdominal pressure probably due to prostate enlarged and mildly symptomatic.  Consider treatment  ED - multifactorial, stress induced , medication and health diagnoses related.  discussed possible trial of Viagra.  Reduce stress when possible.  F/u pending labs  Richard Berg was seen today for sharp stomach pain.  Diagnoses and all orders for this visit:  Abdominal pain, unspecified abdominal location -     CBC -     Comprehensive metabolic panel -     Lipase -     Hemoglobin A1c -     Microalbumin/Creatinine Ratio, Urine  Insulin dependent diabetes mellitus ([facility]) -     CBC -      Comprehensive metabolic panel -     Hemoglobin A1c -     Microalbumin/Creatinine Ratio, Urine  Stomach pain -     Urinalysis Dipstick -     CBC -     Comprehensive metabolic panel -     Lipase -     Hemoglobin A1c -     Microalbumin/Creatinine Ratio, Urine  Hyperlipidemia, unspecified hyperlipidemia type -     CBC -     Comprehensive metabolic panel -     Hemoglobin A1c -     Microalbumin/Creatinine Ratio, Urine  Belching -  CBC -     Comprehensive metabolic panel -     Lipase -     Hemoglobin A1c -     Microalbumin/Creatinine Ratio, Urine  Abdominal pressure -     PSA  Benign prostatic hyperplasia with lower urinary tract symptoms, symptom details unspecified -     PSA  Work stress -     PSA  Erectile dysfunction, unspecified erectile dysfunction type -     PSA  Other orders -     omeprazole (PRILOSEC) 40 MG capsule; Take 1 capsule (40 mg total) by mouth daily. -     PSA

## 2016-01-21 LAB — COMPREHENSIVE METABOLIC PANEL
ALBUMIN: 4.8 g/dL (ref 3.6–5.1)
ALT: 19 U/L (ref 9–46)
AST: 17 U/L (ref 10–40)
Alkaline Phosphatase: 59 U/L (ref 40–115)
BILIRUBIN TOTAL: 0.5 mg/dL (ref 0.2–1.2)
BUN: 12 mg/dL (ref 7–25)
CHLORIDE: 103 mmol/L (ref 98–110)
CO2: 23 mmol/L (ref 20–31)
Calcium: 10.1 mg/dL (ref 8.6–10.3)
Creat: 0.84 mg/dL (ref 0.60–1.35)
Glucose, Bld: 96 mg/dL (ref 65–99)
Potassium: 4.2 mmol/L (ref 3.5–5.3)
Sodium: 140 mmol/L (ref 135–146)
TOTAL PROTEIN: 7.6 g/dL (ref 6.1–8.1)

## 2016-01-21 LAB — HEMOGLOBIN A1C
HEMOGLOBIN A1C: 6.5 % — AB (ref <5.7)
MEAN PLASMA GLUCOSE: 140 mg/dL

## 2016-01-21 LAB — MICROALBUMIN / CREATININE URINE RATIO
Creatinine, Urine: 88 mg/dL (ref 20–370)
MICROALB UR: 0.6 mg/dL
MICROALB/CREAT RATIO: 7 ug/mg{creat} (ref <30)

## 2016-01-21 LAB — LIPASE: LIPASE: 82 U/L — AB (ref 7–60)

## 2016-01-22 ENCOUNTER — Other Ambulatory Visit: Payer: Self-pay | Admitting: Medical

## 2016-01-22 MED ORDER — SILDENAFIL CITRATE 100 MG PO TABS: ORAL_TABLET | ORAL | 0 refills | Status: DC

## 2016-01-27 NOTE — Addendum Note (Signed)
Addended by: Carlena Hurl on: 01/27/2016 04:06 PM   Modules accepted: Orders

## 2016-02-19 ENCOUNTER — Other Ambulatory Visit: Payer: Self-pay | Admitting: Medical

## 2016-02-19 DIAGNOSIS — R109 Unspecified abdominal pain: Secondary | ICD-10-CM

## 2016-02-26 ENCOUNTER — Telehealth: Payer: Self-pay | Admitting: Medical

## 2016-02-26 NOTE — Telephone Encounter (Signed)
Pt called wanting to speak to Louretta Shorten to give an update on how he is doing and get an opinion on if he need to make an appointment. He did not want to give me details. Call pt on cell #

## 2016-02-26 NOTE — Telephone Encounter (Signed)
Pt said that he is still doing the liquid diet and and the meds made him an appt to come next week for a follow up to discuss prancas .

## 2016-03-01 ENCOUNTER — Ambulatory Visit (INDEPENDENT_AMBULATORY_CARE_PROVIDER_SITE_OTHER): Payer: 59 | Admitting: Medical

## 2016-03-01 VITALS — BP 119/74 | HR 72 | Wt 151.2 lb

## 2016-03-01 DIAGNOSIS — N400 Enlarged prostate without lower urinary tract symptoms: Secondary | ICD-10-CM

## 2016-03-01 DIAGNOSIS — K861 Other chronic pancreatitis: Secondary | ICD-10-CM

## 2016-03-01 DIAGNOSIS — R399 Unspecified symptoms and signs involving the genitourinary system: Secondary | ICD-10-CM | POA: Diagnosis not present

## 2016-03-01 DIAGNOSIS — R109 Unspecified abdominal pain: Secondary | ICD-10-CM | POA: Diagnosis not present

## 2016-03-01 DIAGNOSIS — R079 Chest pain, unspecified: Secondary | ICD-10-CM | POA: Diagnosis not present

## 2016-03-01 MED ORDER — OMEPRAZOLE 40 MG PO CPDR: 40.0000 mg | DELAYED_RELEASE_CAPSULE | Freq: Every day | ORAL | 1 refills | Status: DC

## 2016-03-01 MED ORDER — TAMSULOSIN HCL 0.4 MG PO CAPS: 0.4000 mg | ORAL_CAPSULE | Freq: Every day | ORAL | 2 refills | Status: DC

## 2016-03-01 NOTE — Progress Notes (Signed)
Subjective: Chief Complaint  Patient presents with  . follow up    follow up     Here for recheck on abdominal pain.  Last visit 01/20/16 we saw him for several symptom including abdominal and chest pain.   He notes chest and abdominal pains are improved a lot since last visit.   Every now and then will get worse pain.  He did a few days of liquid diet after we called him back last visit,  He has been using meal replacement shakes for breakfast and boiled egg or toast.  Last visit he did the dexilant, and then went to the omeprazole we prescribed.  He notes significant reduction in alcohol use.  He was drinking few shots per night and now drinking 1 shot every few days.    Since last visit still having some pressure in lower abdomen.  Rarely gets up to urinate in the night.  Not sure about urine frequency.  Does have some urinary dribbling,  Sometimes urgently to urinate.   Drinks a lot of coffee in the morning.  Urine color is clearer than it use to be.  No prior medication for prostate enlargement or prostatitis.  No concern for STD.  Married.     He has had colonoscopy with Dr. Benson Norway maybe 2013, thinks it was normal.  No prior EGD.   Has had some stress, 2017 was stressful.    Has problems getting and erections.  2017 work stress has been difficult. Thinks his problems with erections can be stress related but also related to his health issues.   He sometimes gets ok erections, sometimes not.  Since last visit tried 1/2 vigara with some success  No other aggravating or relieving factors. No other complaint.   Past Medical History:  Diagnosis Date  . Constipation   . Diabetes mellitus    dx'd 02/15/11  . Family history of premature CAD    father MI in early 60s  . Hepatitis B immune 03/2014   per titer  . Hyperlipidemia   . Positive TB test    "took the treatment ~ 1993"   Current Outpatient Prescriptions on File Prior to Visit  Medication Sig Dispense Refill  . aspirin EC 81 MG tablet  Take 1 tablet (81 mg total) by mouth daily. 90 tablet 3  . atorvastatin (LIPITOR) 40 MG tablet Take 1 tablet (40 mg total) by mouth daily at 6 PM. 90 tablet 3  . insulin aspart (NOVOLOG FLEXPEN) 100 UNIT/ML FlexPen Inject 0-15 Units into the skin 3 (three) times daily with meals. 15 mL 3  . Insulin Detemir (LEVEMIR) 100 UNIT/ML Pen Inject 15 Units into the skin daily at 10 pm. 5 pen 1  . sildenafil (VIAGRA) 100 MG tablet 1/2 or 1 tablet as needed per day 10 tablet 0   No current facility-administered medications on file prior to visit.    Past Surgical History:  Procedure Laterality Date  . NO PAST SURGERIES     ROS in subjective   Objective: BP 119/74   Pulse 72   Wt 151 lb 3.2 oz (68.6 kg)   SpO2 97%   BMI 22.33 kg/m   Wt Readings from Last 3 Encounters:  03/01/16 151 lb 3.2 oz (68.6 kg)  01/20/16 152 lb 3.2 oz (69 kg)  08/13/15 153 lb 9.6 oz (69.7 kg)   General appearance: alert, no distress, WD/WN, lean AA male oral cavity: MMM, no lesions Neck: supple, no lymphadenopathy, no thyromegaly, no masses  Heart: RRR, normal S1, S2, no murmurs Lungs: CTA bilaterally, no wheezes, rhonchi, or rales Abdomen: +bs, soft, non tender, non distended, no masses, no hepatomegaly, no splenomegaly Extremities: no edema, no cyanosis, no clubbing Pulses: 2+ symmetric, upper and lower extremities, normal cap refill   Assessment: Encounter Diagnoses  Name Primary?  . Abdominal pain, unspecified abdominal location Yes  . Chronic pancreatitis, unspecified pancreatitis type ([place])   . Chest pain, unspecified type   . Benign prostatic hyperplasia, unspecified whether lower urinary tract symptoms present   . Lower urinary tract symptoms (LUTS)   . Stomach pain     Plan: Abdominal pain - much improved with diet changes, alcohol reduction, omperaozle.   C/t omeprazole and avoiding triggers, referral back to GI for f/u and colonoscopy.  bph - begin trial of Flomax.  Call report 2wk.  ED -  multifactorial, stress induced , medication and health diagnoses related.  Reduce stress when possible.  C/t next sample of Viagra and let me know how this does  Refer to GI for updated colonoscopy  Erwin was seen today for follow up.  Diagnoses and all orders for this visit:  Abdominal pain, unspecified abdominal location  Chronic pancreatitis, unspecified pancreatitis type ([place])  Chest pain, unspecified type  Benign prostatic hyperplasia, unspecified whether lower urinary tract symptoms present  Lower urinary tract symptoms (LUTS)  Stomach pain -     omeprazole (PRILOSEC) 40 MG capsule; Take 1 capsule (40 mg total) by mouth daily.  Other orders -     tamsulosin (FLOMAX) 0.4 MG CAPS capsule; Take 1 capsule (0.4 mg total) by mouth daily.

## 2016-03-01 NOTE — Addendum Note (Signed)
Addended by: Tyrone Apple on: 03/01/2016 12:11 PM   Modules accepted: Orders

## 2016-03-02 ENCOUNTER — Encounter: Payer: Self-pay | Admitting: Gastroenterology

## 2016-04-06 ENCOUNTER — Ambulatory Visit (INDEPENDENT_AMBULATORY_CARE_PROVIDER_SITE_OTHER): Payer: 59 | Admitting: Gastroenterology

## 2016-04-06 ENCOUNTER — Other Ambulatory Visit (INDEPENDENT_AMBULATORY_CARE_PROVIDER_SITE_OTHER): Payer: 59

## 2016-04-06 ENCOUNTER — Encounter: Payer: Self-pay | Admitting: Gastroenterology

## 2016-04-06 VITALS — BP 110/72 | HR 78 | Resp 16 | Ht 69.0 in | Wt 150.0 lb

## 2016-04-06 DIAGNOSIS — K625 Hemorrhage of anus and rectum: Secondary | ICD-10-CM

## 2016-04-06 DIAGNOSIS — R1030 Lower abdominal pain, unspecified: Secondary | ICD-10-CM

## 2016-04-06 DIAGNOSIS — K649 Unspecified hemorrhoids: Secondary | ICD-10-CM

## 2016-04-06 LAB — CREATININE, SERUM: Creatinine, Ser: 0.91 mg/dL (ref 0.40–1.50)

## 2016-04-06 LAB — BUN: BUN: 12 mg/dL (ref 6–23)

## 2016-04-06 NOTE — Progress Notes (Signed)
HPI :  51 y/o male with a history of DM, HLD, HTN, here for a new patient visit for abdominal pain.  Pain is located in the lower abdomen, suprapubic area to below the umbilicus, in the middle portion. He reports pain ongoing for roughly a few months. Pain comes and goes and is intermittent, previously was daily but now less frequent. He reports short lasting pain, for a few minutes at a time. No nausea or vomiting. Eating okay. He doesn't think eating elicits the pain. He has tried changing his diet for a few weeks, he's not sure if this has helped. He was also given a trial of omeprazole. He has had some atypical chest pains which has improved with PPI. No dysphagia. No heartburn. He denies any diarrhea, had some occasional constipation. Since changing his diet he has noted improved bowel movements. He has had some blood in the stools intermittently which have been ongoing for a long time, years. He denies any perianal pain. No weight loss. He is not sure if having a bowel movement is related to his pain at all.   No FH of colon cancer. He's had a prior colonoscopy, he is not sure when it was done but states performed by Dr Benson Norway for screening purposes at some point, maybe 5 years ago.     Past Medical History:  Diagnosis Date  . Constipation   . Diabetes mellitus    dx'd 02/15/11  . Family history of premature CAD    father MI in early 46s  . Hepatitis B immune 03/2014   per titer  . Hyperlipidemia   . Positive TB test    "took the treatment ~ 1993"     Past Surgical History:  Procedure Laterality Date  . NO PAST SURGERIES     Family History  Problem Relation Age of Onset  . Diabetes type II Mother   . Diabetes type II Sister   . Diabetes type II Brother    Social History  Substance Use Topics  . Smoking status: Former Smoker    Packs/day: 0.50    Years: 20.00    Types: Cigarettes    Quit date: 12/02/2009  . Smokeless tobacco: Former Systems developer    Types: Chew  . Alcohol use  25.2 oz/week    42 Shots of liquor per week   Current Outpatient Prescriptions  Medication Sig Dispense Refill  . aspirin EC 81 MG tablet Take 1 tablet (81 mg total) by mouth daily. 90 tablet 3  . atorvastatin (LIPITOR) 40 MG tablet Take 1 tablet (40 mg total) by mouth daily at 6 PM. 90 tablet 3  . insulin aspart (NOVOLOG FLEXPEN) 100 UNIT/ML FlexPen Inject 0-15 Units into the skin 3 (three) times daily with meals. 15 mL 3  . Insulin Detemir (LEVEMIR) 100 UNIT/ML Pen Inject 15 Units into the skin daily at 10 pm. 5 pen 1  . omeprazole (PRILOSEC) 40 MG capsule Take 1 capsule (40 mg total) by mouth daily. 30 capsule 1  . sildenafil (VIAGRA) 100 MG tablet 1/2 or 1 tablet as needed per day 10 tablet 0  . tamsulosin (FLOMAX) 0.4 MG CAPS capsule Take 1 capsule (0.4 mg total) by mouth daily. 30 capsule 2   No current facility-administered medications for this visit.    Allergies  Allergen Reactions  . Lisinopril Swelling    Lip swelling  . Penicillins Hives     Review of Systems: All systems reviewed and negative except where noted in  HPI.   Lab Results  Component Value Date   WBC 3.7 (L) 01/20/2016   HGB 14.6 01/20/2016   HCT 43.0 01/20/2016   MCV 88.7 01/20/2016   PLT 243 01/20/2016    Lab Results  Component Value Date   CREATININE 0.91 04/06/2016   BUN 12 04/06/2016   NA 140 01/20/2016   K 4.2 01/20/2016   CL 103 01/20/2016   CO2 23 01/20/2016    Lab Results  Component Value Date   ALT 19 01/20/2016   AST 17 01/20/2016   ALKPHOS 59 01/20/2016   BILITOT 0.5 01/20/2016     Physical Exam: BP 110/72   Pulse 78   Resp 16   Ht 5\' 9"  (1.753 m)   Wt 150 lb (68 kg)   BMI 22.15 kg/m  Constitutional: Pleasant,well-developed, male in no acute distress. HEENT: Normocephalic and atraumatic. Conjunctivae are normal. No scleral icterus. Neck supple.  Cardiovascular: Normal rate, regular rhythm.  Pulmonary/chest: Effort normal and breath sounds normal. No wheezing, rales  or rhonchi. Abdominal: Soft, nondistended, nontender. Negative Carn[place], no hernia appreciated. There are no masses palpable. No hepatomegaly. DRE / Anoscopy - external hemorrhoids, internal hemorrhoids in all positions, no mass or fissure appreciated Extremities: no edema Lymphadenopathy: No cervical adenopathy noted. Neurological: Alert and oriented to person place and time. Skin: Skin is warm and dry. No rashes noted. Psychiatric: Normal mood and affect. Behavior is normal.   ASSESSMENT AND PLAN: 51 year old male here for new patient evaluation of the following issues:  Lower abdominal pain - unclear etiology, persistent for a few months without any clear triggers. He endorses a prior colonoscopy and we will obtain this report. His labs are otherwise unremarkable. I discussed differential diagnosis with him. It's possible this could be musculoskeletal however given his symptoms have persisted recommend CT abdomen and pelvis with contrast to further evaluate. He wished to proceed with this we'll let him know the results.  Rectal bleeding / hemorrhoids - he has internal hemorrhoids on anoscopy which is the likely source if he's had a fairly recent colonoscopy however we will obtain records from Dr. Ulyses Amor office to confirm findings on his last exam, this he reports symptoms have been ongoing for years. If he is due for colonoscopy we can coordinate this for him. Otherwise discussed management of hemorrhoids, recommend daily fiber supplement at this time. We discussed hemorrhoid banding as well, and he may consider this in the future. Will await course with fiber supplement and await colonoscopy record prior to proceeding with this. He agreed.  Skyline Cellar, MD [facility] Pager [phone]  CC: Tysinger, Camelia Eng, PA-C

## 2016-04-06 NOTE — Patient Instructions (Addendum)
If you are age 50 or older, your body mass index should be between 23-30. Your Body mass index is 22.15 kg/m. If this is out of the aforementioned range listed, please consider follow up with your Primary Care Provider.  If you are age 59 or younger, your body mass index should be between 19-25. Your Body mass index is 22.15 kg/m. If this is out of the aformentioned range listed, please consider follow up with your Primary Care Provider.   You have been scheduled for a CT scan of the abdomen and pelvis at [facility] ([id number]---this is in the same building as Press [facility]).   You are scheduled on Friday, April 6th at 3:15pm. You should arrive 15 minutes prior to your appointment time for registration. Please follow the written instructions below on the day of your exam:  WARNING: IF YOU ARE ALLERGIC TO IODINE/X-RAY DYE, PLEASE NOTIFY RADIOLOGY IMMEDIATELY AT [phone]! YOU WILL BE GIVEN A 13 HOUR PREMEDICATION PREP.  1) Do not eat anything after 11:15am (4 hours prior to your test) You may have liquids. 2) You have been given 2 bottles of oral contrast to drink. The solution may taste better if refrigerated, but do NOT add ice or any other liquid to this solution. Shake well before drinking.    Drink 1 bottle of contrast @ 1:15pm (2 hours prior to your exam)  Drink 1 bottle of contrast @ 2:15pm (1 hour prior to your exam)  You may take any medications as prescribed with a small amount of water except for the following: Metformin, Glucophage, Glucovance, Avandamet, Riomet, Fortamet, Actoplus Met, Janumet, Glumetza or Metaglip. The above medications must be held the day of the exam AND 48 hours after the exam.  The purpose of you drinking the oral contrast is to aid in the visualization of your intestinal tract. The contrast solution may cause some diarrhea. Before your exam is started, you will be given a small amount of fluid to drink. Depending on your individual  set of symptoms, you may also receive an intravenous injection of x-ray contrast/dye. Plan on being at [facility] for 30 minutes or longer, depending on the type of exam you are having performed.  This test typically takes 30-45 minutes to complete.  If you have any questions regarding your exam or if you need to reschedule, you may call the CT department at [phone] between the hours of 8:00 am and 5:00 pm, Monday-Friday.  ________________________________________________________________________  Your physician has requested that you go to the basement for the following lab work before leaving today:  BUN, Creatinine  Thank you.

## 2016-04-09 ENCOUNTER — Ambulatory Visit (INDEPENDENT_AMBULATORY_CARE_PROVIDER_SITE_OTHER)
Admission: RE | Admit: 2016-04-09 | Discharge: 2016-04-09 | Disposition: A | Discharge status: Home / Self Care | Payer: 59 | Source: Ambulatory Visit | Attending: Gastroenterology | Admitting: Gastroenterology

## 2016-04-09 DIAGNOSIS — K649 Unspecified hemorrhoids: Secondary | ICD-10-CM | POA: Diagnosis not present

## 2016-04-09 DIAGNOSIS — R1030 Lower abdominal pain, unspecified: Secondary | ICD-10-CM | POA: Diagnosis not present

## 2016-04-09 MED ORDER — IOPAMIDOL (ISOVUE-300) INJECTION 61%
100.0000 mL | Freq: Once | INTRAVENOUS | Status: AC | PRN
  Administered 2016-04-09: 100 mL via INTRAVENOUS

## 2016-04-19 ENCOUNTER — Other Ambulatory Visit: Payer: Self-pay

## 2016-04-19 ENCOUNTER — Telehealth: Payer: Self-pay

## 2016-04-19 MED ORDER — TAMSULOSIN HCL 0.4 MG PO CAPS: 0.4000 mg | ORAL_CAPSULE | Freq: Every day | ORAL | 2 refills | Status: DC

## 2016-04-19 NOTE — Telephone Encounter (Signed)
Sent refill to pharmacy. 

## 2016-04-19 NOTE — Telephone Encounter (Signed)
Pt needs refill of tamsulosin 0.4mg  called to [facility]. Richard Berg

## 2016-04-21 ENCOUNTER — Telehealth: Payer: Self-pay

## 2016-04-21 NOTE — Telephone Encounter (Signed)
Sent this on 04/20/16.

## 2016-04-21 NOTE — Telephone Encounter (Signed)
Pt requesting 90 day supply of Tamulosin called to [facility] pharmacy. ?RLB

## 2016-09-08 LAB — HM DIABETES EYE EXAM

## 2016-10-07 ENCOUNTER — Ambulatory Visit (INDEPENDENT_AMBULATORY_CARE_PROVIDER_SITE_OTHER): Payer: 59 | Admitting: Medical

## 2016-10-07 ENCOUNTER — Encounter: Payer: Self-pay | Admitting: Medical

## 2016-10-07 VITALS — BP 124/74 | HR 87 | Ht 68.0 in | Wt 143.2 lb

## 2016-10-07 DIAGNOSIS — Z7189 Other specified counseling: Secondary | ICD-10-CM | POA: Diagnosis not present

## 2016-10-07 DIAGNOSIS — IMO0001 Reserved for inherently not codable concepts without codable children: Secondary | ICD-10-CM

## 2016-10-07 DIAGNOSIS — Z1211 Encounter for screening for malignant neoplasm of colon: Secondary | ICD-10-CM

## 2016-10-07 DIAGNOSIS — Z136 Encounter for screening for cardiovascular disorders: Secondary | ICD-10-CM | POA: Insufficient documentation

## 2016-10-07 DIAGNOSIS — Z0000 Encounter for general adult medical examination without abnormal findings: Secondary | ICD-10-CM | POA: Diagnosis not present

## 2016-10-07 DIAGNOSIS — Z2821 Immunization not carried out because of patient refusal: Secondary | ICD-10-CM | POA: Insufficient documentation

## 2016-10-07 DIAGNOSIS — N529 Male erectile dysfunction, unspecified: Secondary | ICD-10-CM | POA: Diagnosis not present

## 2016-10-07 DIAGNOSIS — M25512 Pain in left shoulder: Secondary | ICD-10-CM

## 2016-10-07 DIAGNOSIS — K036 Deposits [accretions] on teeth: Secondary | ICD-10-CM | POA: Diagnosis not present

## 2016-10-07 DIAGNOSIS — E785 Hyperlipidemia, unspecified: Secondary | ICD-10-CM

## 2016-10-07 DIAGNOSIS — G8929 Other chronic pain: Secondary | ICD-10-CM | POA: Insufficient documentation

## 2016-10-07 DIAGNOSIS — M436 Torticollis: Secondary | ICD-10-CM

## 2016-10-07 DIAGNOSIS — Z794 Long term (current) use of insulin: Secondary | ICD-10-CM | POA: Diagnosis not present

## 2016-10-07 DIAGNOSIS — E119 Type 2 diabetes mellitus without complications: Secondary | ICD-10-CM

## 2016-10-07 DIAGNOSIS — M542 Cervicalgia: Secondary | ICD-10-CM

## 2016-10-07 DIAGNOSIS — Z7185 Encounter for immunization safety counseling: Secondary | ICD-10-CM

## 2016-10-07 LAB — LIPID PANEL
CHOLESTEROL: 192 mg/dL (ref <200)
HDL: 107 mg/dL (ref >40)
LDL CHOLESTEROL (CALC): 72 mg/dL
Non-HDL Cholesterol (Calc): 85 mg/dL (calc) (ref <130)
Total CHOL/HDL Ratio: 1.8 (calc) (ref <5.0)
Triglycerides: 52 mg/dL (ref <150)

## 2016-10-07 LAB — CBC WITH DIFFERENTIAL/PLATELET
BASOS ABS: 41 {cells}/uL (ref 0–200)
BASOS PCT: 0.9 %
EOS ABS: 41 {cells}/uL (ref 15–500)
Eosinophils Relative: 0.9 %
HCT: 40.7 % (ref 38.5–50.0)
HEMOGLOBIN: 13.9 g/dL (ref 13.2–17.1)
Lymphs Abs: 2129 cells/uL (ref 850–3900)
MCH: 30.1 pg (ref 27.0–33.0)
MCHC: 34.2 g/dL (ref 32.0–36.0)
MCV: 88.1 fL (ref 80.0–100.0)
MPV: 10.2 fL (ref 7.5–12.5)
Monocytes Relative: 12.3 %
NEUTROS ABS: 1737 {cells}/uL (ref 1500–7800)
Neutrophils Relative %: 38.6 %
Platelets: 256 10*3/uL (ref 140–400)
RBC: 4.62 10*6/uL (ref 4.20–5.80)
RDW: 12.9 % (ref 11.0–15.0)
TOTAL LYMPHOCYTE: 47.3 %
WBC: 4.5 10*3/uL (ref 3.8–10.8)
WBCMIX: 554 {cells}/uL (ref 200–950)

## 2016-10-07 LAB — COMPREHENSIVE METABOLIC PANEL
AG Ratio: 1.7 (calc) (ref 1.0–2.5)
ALBUMIN MSPROF: 4.5 g/dL (ref 3.6–5.1)
ALKALINE PHOSPHATASE (APISO): 64 U/L (ref 40–115)
ALT: 17 U/L (ref 9–46)
AST: 19 U/L (ref 10–35)
BILIRUBIN TOTAL: 0.6 mg/dL (ref 0.2–1.2)
BUN: 12 mg/dL (ref 7–25)
CALCIUM: 9.6 mg/dL (ref 8.6–10.3)
CO2: 27 mmol/L (ref 20–32)
Chloride: 101 mmol/L (ref 98–110)
Creat: 0.83 mg/dL (ref 0.70–1.33)
Globulin: 2.6 g/dL (calc) (ref 1.9–3.7)
Glucose, Bld: 63 mg/dL — ABNORMAL LOW (ref 65–99)
POTASSIUM: 4.7 mmol/L (ref 3.5–5.3)
Sodium: 134 mmol/L — ABNORMAL LOW (ref 135–146)
Total Protein: 7.1 g/dL (ref 6.1–8.1)

## 2016-10-07 LAB — POCT GLYCOSYLATED HEMOGLOBIN (HGB A1C): HEMOGLOBIN A1C: 7.7

## 2016-10-07 NOTE — Progress Notes (Signed)
Subjective:   HPI  Richard Berg is a 51 y.o. male who presents for Chief Complaint  Patient presents with  . Annual Exam    physical , pain in neckx 2 months     Medical care team includes: Jordyn Doane, Camelia Eng, PA-C here for primary care Dentist Eye doctor   Concerns: 66mo intermittent pains in left neck, was worse with decreased ROM.  Has improved, but still having some pains in the neck.   Diabetes - not checking sugars regularly.  Is still taking Levemir 15u QHS, novolog 1-2u with meals.  No prior statin.  Reviewed their medical, surgical, family, social, medication, and allergy history and updated chart as appropriate.  Past Medical History:  Diagnosis Date  . Constipation   . Diabetes mellitus    dx'd 02/15/11  . Family history of premature CAD    father MI in early 2s  . Hepatitis B immune 03/2014   per titer  . Hyperlipidemia   . Positive TB test    "took the treatment ~ 1993"    Past Surgical History:  Procedure Laterality Date  . NO PAST SURGERIES      Social History   Social History  . Marital status: Married    Spouse name: N/A  . Number of children: N/A  . Years of education: N/A   Occupational History  . Not on file.   Social History Main Topics  . Smoking status: Former Smoker    Packs/day: 0.50    Years: 20.00    Types: Cigarettes    Quit date: 12/02/2009  . Smokeless tobacco: Former Systems developer    Types: Chew  . Alcohol use 25.2 oz/week    42 Shots of liquor per week  . Drug use: Yes    Types: Marijuana     Comment: marijuana "last time ~ 1987"  . Sexual activity: Yes   Other Topics Concern  . Not on file   Social History Narrative  . No narrative on file    Family History  Problem Relation Age of Onset  . Diabetes type II Mother   . Diabetes type II Sister   . Diabetes type II Brother      Current Outpatient Prescriptions:  .  aspirin EC 81 MG tablet, Take 1 tablet (81 mg total) by mouth daily., Disp: 90 tablet, Rfl:  3 .  insulin aspart (NOVOLOG FLEXPEN) 100 UNIT/ML FlexPen, Inject 0-15 Units into the skin 3 (three) times daily with meals., Disp: 15 mL, Rfl: 3 .  Insulin Detemir (LEVEMIR) 100 UNIT/ML Pen, Inject 15 Units into the skin daily at 10 pm., Disp: 5 pen, Rfl: 1 .  sildenafil (VIAGRA) 100 MG tablet, 1/2 or 1 tablet as needed per day (Patient not taking: Reported on 10/07/2016), Disp: 10 tablet, Rfl: 0  Allergies  Allergen Reactions  . Lisinopril Swelling    Lip swelling  . Penicillins Hives     Review of Systems Constitutional: -fever, -chills, -sweats, -unexpected weight change, -decreased appetite, -fatigue Allergy: -sneezing, -itching, -congestion Dermatology: -changing moles, --rash, -lumps ENT: -runny nose, -ear pain, -sore throat, -hoarseness, -sinus pain, -teeth pain, - ringing in ears, -hearing loss, -nosebleeds Cardiology: -chest pain, -palpitations, -swelling, -difficulty breathing when lying flat, -waking up short of breath Respiratory: -cough, -shortness of breath, -difficulty breathing with exercise or exertion, -wheezing, -coughing up blood Gastroenterology: -abdominal pain, -nausea, -vomiting, -diarrhea, -constipation, -blood in stool, -changes in bowel movement, -difficulty swallowing or eating Hematology: -bleeding, -bruising  Musculoskeletal: +joint aches, -muscle  aches, -joint swelling, -back pain, +neck pain, -cramping, -changes in gait Ophthalmology: denies vision changes, eye redness, itching, discharge Urology: -burning with urination, -difficulty urinating, -blood in urine, -urinary frequency, -urgency, -incontinence Neurology: -headache, -weakness, -tingling, -numbness, -memory loss, -falls, -dizziness Psychology: -depressed mood, -agitation, -sleep problems     Objective:   BP 124/74   Pulse 87   Ht 5\' 8"  (1.727 m)   Wt 143 lb 3.2 oz (65 kg)   SpO2 96%   BMI 21.77 kg/m   General appearance: alert, no distress, WD/WN, African American male Skin:  unremarkable HEENT: normocephalic, conjunctiva/corneas normal, sclerae anicteric, PERRLA, EOMi, nares patent, no discharge or erythema, pharynx normal Oral cavity: MMM, tongue normal, moderate plaque, teeth in good repair othewrise Neck: mild tenderness bilat, decreased ROM with rotation and flexion, but extension normal, otherwise supple, no lymphadenopathy, no thyromegaly, no masses, normal ROM, no bruits Chest: non tender, normal shape and expansion Heart: RRR, normal S1, S2, no murmurs Lungs: CTA bilaterally, no wheezes, rhonchi, or rales Abdomen: +bs, soft, non tender, non distended, no masses, no hepatomegaly, no splenomegaly, no bruits Back: non tender, normal ROM, no scoliosis Musculoskeletal: upper extremities non tender, no obvious deformity, normal ROM throughout, lower extremities non tender, no obvious deformity, normal ROM throughout Extremities: no edema, no cyanosis, no clubbing Pulses: 2+ symmetric, upper and lower extremities, normal cap refill Neurological: alert, oriented x 3, CN2-12 intact, strength normal upper extremities and lower extremities, sensation normal throughout, DTRs 2+ throughout, no cerebellar signs, gait normal Psychiatric: normal affect, behavior normal, pleasant  GU: normal male external genitalia,circumcised, nontender, no masses, no hernia, no lymphadenopathy Rectal: deferred  Diabetic Foot Exam - Simple   Simple Foot Form Diabetic Foot exam was performed with the following findings:  Yes 10/12/2016 10:16 AM  Visual Inspection No deformities, no ulcerations, no other skin breakdown bilaterally:  Yes Sensation Testing Intact to touch and monofilament testing bilaterally:  Yes Pulse Check Posterior Tibialis and Dorsalis pulse intact bilaterally:  Yes Comments     Assessment and Plan :    Encounter Diagnoses  Name Primary?  . Routine general medical examination at a health care facility Yes  . Insulin dependent diabetes mellitus ([place])   .  Erectile dysfunction, unspecified erectile dysfunction type   . Hyperlipidemia, unspecified hyperlipidemia type   . Vaccine counseling   . Dental plaque   . Neck pain   . Chronic left shoulder pain   . Torticollis   . Influenza vaccination declined   . Screen for colon cancer     Physical exam - discussed and counseled on healthy lifestyle, diet, exercise, preventative care, vaccinations, sick and well care, proper use of emergency dept and after hours care, and addressed their concerns.    Health screening: See your eye doctor yearly for routine vision care. See your dentist yearly for routine dental care including hygiene visits twice yearly.  Cancer screening Discussed colonoscopy screening Discussed PSA, prostate exam, and prostate cancer screening  Vaccinations: Counseled on the following vaccines:  Flu, tdap, Shingrix, pneumococcal.  He declines all but will consider  Acute issues discussed: Neck pain, torticollis - discussed short term use of NSAID, stretching, ROM activity.   Call back if not resolved in the next week.  Separate significant chronic issues discussed: C/t current medications, f/u pending labs  Kin was seen today for annual exam.  Diagnoses and all orders for this visit:  Routine general medical examination at a health care facility -     POCT  Urinalysis DIP (Proadvantage Device) -     Comprehensive metabolic panel -     Lipid panel -     CBC with Differential/Platelet  Insulin dependent diabetes mellitus (HCC) -     HgB A1c  Erectile dysfunction, unspecified erectile dysfunction type  Hyperlipidemia, unspecified hyperlipidemia type -     Lipid panel  Vaccine counseling  Dental plaque  Neck pain  Chronic left shoulder pain  Torticollis  Influenza vaccination declined  Screen for colon cancer   Follow-up pending labs, yearly for physical

## 2016-10-07 NOTE — Patient Instructions (Signed)
Massage Therapy:  Janet Blevins [facility] [address] [place],  [address] [phone] [email or web address]   

## 2016-10-12 ENCOUNTER — Other Ambulatory Visit: Payer: Self-pay | Admitting: Medical

## 2016-10-12 MED ORDER — INSULIN DETEMIR 100 UNIT/ML FLEXPEN: 17.0000 [IU] | PEN_INJECTOR | Freq: Every day | SUBCUTANEOUS | 3 refills | Status: DC

## 2016-10-12 MED ORDER — SILDENAFIL CITRATE 100 MG PO TABS: ORAL_TABLET | ORAL | 5 refills | Status: DC

## 2016-10-12 MED ORDER — INSULIN ASPART 100 UNIT/ML FLEXPEN: 0.0000 [IU] | PEN_INJECTOR | Freq: Three times a day (TID) | SUBCUTANEOUS | 3 refills | Status: DC

## 2016-10-12 MED ORDER — ASPIRIN EC 81 MG PO TBEC: 81.0000 mg | DELAYED_RELEASE_TABLET | Freq: Every day | ORAL | 3 refills | Status: DC

## 2016-10-12 MED ORDER — CYCLOBENZAPRINE HCL 10 MG PO TABS: 10.0000 mg | ORAL_TABLET | Freq: Every evening | ORAL | 0 refills | Status: DC | PRN

## 2016-10-12 MED ORDER — PRAVASTATIN SODIUM 20 MG PO TABS: 20.0000 mg | ORAL_TABLET | Freq: Every evening | ORAL | 0 refills | Status: DC

## 2016-10-12 MED ORDER — INSULIN PEN NEEDLE 32G X 4 MM MISC: 1.0000 | Freq: Three times a day (TID) | 11 refills | Status: DC

## 2016-10-13 ENCOUNTER — Encounter: Payer: 59 | Admitting: Medical

## 2017-01-12 ENCOUNTER — Ambulatory Visit: Payer: BLUE CROSS/BLUE SHIELD | Admitting: Medical

## 2017-01-12 VITALS — BP 126/78 | HR 68 | Wt 145.0 lb

## 2017-01-12 DIAGNOSIS — Z7189 Other specified counseling: Secondary | ICD-10-CM | POA: Diagnosis not present

## 2017-01-12 DIAGNOSIS — Z7185 Encounter for immunization safety counseling: Secondary | ICD-10-CM

## 2017-01-12 DIAGNOSIS — E785 Hyperlipidemia, unspecified: Secondary | ICD-10-CM | POA: Diagnosis not present

## 2017-01-12 DIAGNOSIS — E119 Type 2 diabetes mellitus without complications: Secondary | ICD-10-CM | POA: Diagnosis not present

## 2017-01-12 DIAGNOSIS — Z1211 Encounter for screening for malignant neoplasm of colon: Secondary | ICD-10-CM | POA: Diagnosis not present

## 2017-01-12 DIAGNOSIS — Z794 Long term (current) use of insulin: Secondary | ICD-10-CM | POA: Diagnosis not present

## 2017-01-12 DIAGNOSIS — Z2821 Immunization not carried out because of patient refusal: Secondary | ICD-10-CM

## 2017-01-12 DIAGNOSIS — IMO0001 Reserved for inherently not codable concepts without codable children: Secondary | ICD-10-CM

## 2017-01-12 LAB — COMPREHENSIVE METABOLIC PANEL
AG Ratio: 1.8 (calc) (ref 1.0–2.5)
ALT: 13 U/L (ref 9–46)
AST: 17 U/L (ref 10–35)
Albumin: 4.7 g/dL (ref 3.6–5.1)
Alkaline phosphatase (APISO): 61 U/L (ref 40–115)
BUN: 12 mg/dL (ref 7–25)
CO2: 29 mmol/L (ref 20–32)
Calcium: 10.1 mg/dL (ref 8.6–10.3)
Chloride: 102 mmol/L (ref 98–110)
Creat: 0.93 mg/dL (ref 0.70–1.33)
GLUCOSE: 151 mg/dL — AB (ref 65–99)
Globulin: 2.6 g/dL (calc) (ref 1.9–3.7)
Potassium: 4.9 mmol/L (ref 3.5–5.3)
SODIUM: 138 mmol/L (ref 135–146)
Total Bilirubin: 0.4 mg/dL (ref 0.2–1.2)
Total Protein: 7.3 g/dL (ref 6.1–8.1)

## 2017-01-12 MED ORDER — INSULIN PEN NEEDLE 32G X 4 MM MISC: 1.0000 | Freq: Three times a day (TID) | 11 refills | Status: DC

## 2017-01-12 MED ORDER — INSULIN ASPART 100 UNIT/ML FLEXPEN: 0.0000 [IU] | PEN_INJECTOR | Freq: Three times a day (TID) | SUBCUTANEOUS | 3 refills | Status: DC

## 2017-01-12 MED ORDER — PRAVASTATIN SODIUM 20 MG PO TABS: 20.0000 mg | ORAL_TABLET | Freq: Every evening | ORAL | 2 refills | Status: DC

## 2017-01-12 MED ORDER — INSULIN DETEMIR 100 UNIT/ML FLEXPEN: 17.0000 [IU] | PEN_INJECTOR | Freq: Every day | SUBCUTANEOUS | 5 refills | Status: DC

## 2017-01-12 NOTE — Progress Notes (Signed)
Subjective: Chief Complaint  Patient presents with  . Diabetes    dm check no other concerns    Here for diabetes recheck.   Diabetes - compliant with Novolog 2-3 u with meals, Levemir 15 u QHS.   Sugars looking good.   No hypoglycemia.    hyperlipidemia - compliant with Pravachol 20mg  daily, sometimes misses a few doses.  exercise- walking, but also physical on the job.    Relationship is ok currently.   Both he and wife are busy.   No new c/o.  Past Medical History:  Diagnosis Date  . Constipation   . Diabetes mellitus    dx'd 02/15/11  . Family history of premature CAD    father MI in early 80s  . Hepatitis B immune 03/2014   per titer  . Hyperlipidemia   . Positive TB test    "took the treatment ~ 1993"   Current Outpatient Medications on File Prior to Visit  Medication Sig Dispense Refill  . aspirin EC 81 MG tablet Take 1 tablet (81 mg total) by mouth daily. 90 tablet 3  . insulin aspart (NOVOLOG FLEXPEN) 100 UNIT/ML FlexPen Inject 0-15 Units into the skin 3 (three) times daily with meals. 15 mL 3  . Insulin Detemir (LEVEMIR) 100 UNIT/ML Pen Inject 17 Units into the skin daily at 10 pm. 10 pen 3  . Insulin Pen Needle (BD PEN NEEDLE NANO U/F) 32G X 4 MM MISC 1 each by Does not apply route 3 (three) times daily. 100 each 11  . pravastatin (PRAVACHOL) 20 MG tablet Take 1 tablet (20 mg total) by mouth every evening. 90 tablet 0  . sildenafil (VIAGRA) 100 MG tablet 1/2 or 1 tablet as needed per day 10 tablet 5   No current facility-administered medications on file prior to visit.    ROS as in subjective   Objective: BP 126/78   Pulse 68   Wt 145 lb (65.8 kg)   SpO2 97%   BMI 22.05 kg/m   Wt Readings from Last 3 Encounters:  01/12/17 145 lb (65.8 kg)  10/07/16 143 lb 3.2 oz (65 kg)  04/06/16 150 lb (68 kg)   General appearance: alert, no distress, WD/WN, lean AA male Oral cavity: MMM, no lesions Neck: supple, no lymphadenopathy, no thyromegaly, no masses, no  bruit Heart: RRR, normal S1, S2, no murmurs Lungs: CTA bilaterally, no wheezes, rhonchi, or rales Abdomen: +bs, soft, non tender, non distended, no masses, no hepatomegaly, no splenomegaly Pulses: 2+ symmetric, upper and lower extremities, normal cap refill  Diabetic Foot Exam - Simple   Simple Foot Form Visual Inspection No deformities, no ulcerations, no other skin breakdown bilaterally:  Yes Sensation Testing Intact to touch and monofilament testing bilaterally:  Yes Pulse Check Posterior Tibialis and Dorsalis pulse intact bilaterally:  Yes Comments      Assessment: Encounter Diagnoses  Name Primary?  . Insulin dependent diabetes mellitus ([place]) Yes  . Hyperlipidemia, unspecified hyperlipidemia type   . Influenza vaccination declined   . Vaccine counseling   . Screen for colon cancer      Plan: Diabetes-great control, continue current medications, healthy diet, routine exercise, daily foot checks, yearly eye exam.  Hyperlipidemia-continue same medication reviewed October 2018 labs  Screen for colon cancer-reviewed 2011 colonoscopy.  Will send home with  stool cards x3  Counseled on yearly flu shot, pneumococcal and shingles vaccine.   He declines all today but will consider.  He will be due for Td vaccines later  this year.  Last Td was 08/2007  PSA from 01/2016 reviewed, normal.  Korey was seen today for diabetes.  Diagnoses and all orders for this visit:  Insulin dependent diabetes mellitus ([place]) -     HgB A1c -     Comprehensive metabolic panel  Hyperlipidemia, unspecified hyperlipidemia type -     Comprehensive metabolic panel  Influenza vaccination declined  Vaccine counseling  Screen for colon cancer

## 2017-01-13 LAB — POCT GLYCOSYLATED HEMOGLOBIN (HGB A1C): HEMOGLOBIN A1C: 6.9

## 2017-09-08 LAB — HM DIABETES EYE EXAM

## 2017-10-10 ENCOUNTER — Encounter: Payer: 59 | Admitting: Medical

## 2017-10-27 ENCOUNTER — Encounter: Payer: Self-pay | Admitting: Family Medicine

## 2017-10-27 ENCOUNTER — Ambulatory Visit: Payer: BLUE CROSS/BLUE SHIELD | Admitting: Family Medicine

## 2017-10-27 VITALS — BP 130/74 | HR 88 | Temp 98.8°F | Ht 69.0 in | Wt 144.8 lb

## 2017-10-27 DIAGNOSIS — J069 Acute upper respiratory infection, unspecified: Secondary | ICD-10-CM

## 2017-10-27 DIAGNOSIS — Z794 Long term (current) use of insulin: Secondary | ICD-10-CM | POA: Diagnosis not present

## 2017-10-27 DIAGNOSIS — E119 Type 2 diabetes mellitus without complications: Secondary | ICD-10-CM | POA: Diagnosis not present

## 2017-10-27 DIAGNOSIS — IMO0001 Reserved for inherently not codable concepts without codable children: Secondary | ICD-10-CM

## 2017-10-27 DIAGNOSIS — H6691 Otitis media, unspecified, right ear: Secondary | ICD-10-CM | POA: Diagnosis not present

## 2017-10-27 MED ORDER — AZITHROMYCIN 250 MG PO TABS: ORAL_TABLET | ORAL | 0 refills | Status: DC

## 2017-10-27 NOTE — Patient Instructions (Signed)
  Stay well hydrated, drink plenty of water. Your right ear drum was somewhat red. Your right nostril was very swollen. Continue to use decongestants regularly to help with the nasal congestion and ear pain (ie theraflu).  If you develop fever, or if your have persistent ear pain tomorrow, go ahead and take the full course of antibiotic (zpak). If you feel better tomorrow, no ear pain or fever, you likely will not need any antibiotics at all.  If you prefer an alternative to theraflu, you can try 12 hour pseudoephedrine (you need to show your licence to get from behind the counter at the pharmacy.

## 2017-10-27 NOTE — Progress Notes (Signed)
Chief Complaint  Patient presents with  . Cough    started Tuesday night. Is coughing up green mucus and had some clood in it today. Used Alka seltzer cold yesterday all day and TheraFlu last night. Right ear has some pain and pressure in it. Not a lot of nasal drainage, only slight. No fever, body aches or chills.   . Flu Vaccine    declined.     2 nights ago he started with sore throat, cough, feeling run down/weak.  He felt worse yesterday, had no energy.  Took alka selzer the whole day, had to leave work at [employer]. His right ear started aching, right nostril felt clogged.  Felt a little better last night and today, taking Theraflu. Right ear now hurts only with coughing, is ringing, not as painful as last night. Feels worse as the Theraflu wears off.  PMH, PSH, SH reviewed. Diabetic, controlled per last check (past due for med check). Lab Results  Component Value Date   HGBA1C 6.9 01/13/2017    Outpatient Encounter Medications as of 10/27/2017  Medication Sig  . aspirin EC 81 MG tablet Take 1 tablet (81 mg total) by mouth daily.  . insulin aspart (NOVOLOG FLEXPEN) 100 UNIT/ML FlexPen Inject 0-15 Units into the skin 3 (three) times daily with meals.  . Insulin Detemir (LEVEMIR) 100 UNIT/ML Pen Inject 17 Units into the skin daily at 10 pm.  . Insulin Pen Needle (BD PEN NEEDLE NANO U/F) 32G X 4 MM MISC 1 each by Does not apply route 3 (three) times daily.  Marland Kitchen Phenylephrine-Pheniramine-DM (THERAFLU COLD & COUGH PO) Take by mouth.  . pravastatin (PRAVACHOL) 20 MG tablet Take 1 tablet (20 mg total) by mouth every evening.  . sildenafil (VIAGRA) 100 MG tablet 1/2 or 1 tablet as needed per day   No facility-administered encounter medications on file as of 10/27/2017.    Allergies  Allergen Reactions  . Lisinopril Swelling    Lip swelling  . Penicillins Hives   ROS: No fever, chills, nausea, vomiting, diarrhea. No sick contacts. No shortness of breath, chest pain, rash or other concerns,  except as noted in HPI.  PHYSICAL EXAM:  BP 130/74   Pulse 88   Temp 98.8 F (37.1 C) (Tympanic)   Ht 5\' 9"  (1.753 m)   Wt 144 lb 12.8 oz (65.7 kg)   SpO2 98%   BMI 21.38 kg/m   Pleasant, well-appearing male, in no distress HEENT: conjunctiva and sclera are clear, EOMI.  R TM was red, some bubbles noted behind TM. Left TM and EAC normal. Nasal mucosa mod edematous on the right, normal on the left Sinuses nontender, OP clear Neck: no lymphadenopathy or mass Heart: regular rate and rhythm, no murmur Lungs: clear bilaterally Extremities: no edema Skin: normal turgor, no rash Psych: normal mood, affect, hygiene and grooming Neuro: alert and oriented, cranial nerves intact, normal gait   ASSESSMENT/PLAN:  Viral upper respiratory illness - suspect viral illness.  OM may also be viral, as pain is improving. supportive measures  Acute otitis media, right - possibly viral rather than bacterial, better than yesterday. Cont decongestants. Start ABX if pain persists/worsens - Plan: azithromycin (ZITHROMAX) 250 MG tablet  Insulin dependent diabetes mellitus ([place]) - past due for med check; encouraged him to reschedule with his PCP, Audelia Acton  Has work PE Monday then will call to r/s with Audelia Acton.  He declined flu shot, despite extensive counseling.    Stay well hydrated, drink plenty of water. Your  right ear drum was somewhat red. Your right nostril was very swollen. Continue to use decongestants regularly to help with the nasal congestion and ear pain (ie theraflu).  If you develop fever, or if your have persistent ear pain tomorrow, go ahead and take the full course of antibiotic (zpak). If you feel better tomorrow, no ear pain or fever, you likely will not need any antibiotics at all.  If you prefer an alternative to theraflu, you can try 12 hour pseudoephedrine (you need to show your licence to get from behind the counter at the pharmacy.

## 2018-02-13 DIAGNOSIS — L0291 Cutaneous abscess, unspecified: Secondary | ICD-10-CM | POA: Diagnosis not present

## 2018-02-16 ENCOUNTER — Ambulatory Visit: Payer: BLUE CROSS/BLUE SHIELD | Admitting: Medical

## 2018-02-16 ENCOUNTER — Encounter: Payer: Self-pay | Admitting: Medical

## 2018-02-16 DIAGNOSIS — L0291 Cutaneous abscess, unspecified: Secondary | ICD-10-CM | POA: Diagnosis not present

## 2018-02-16 DIAGNOSIS — R51 Headache: Secondary | ICD-10-CM

## 2018-02-16 DIAGNOSIS — R519 Headache, unspecified: Secondary | ICD-10-CM

## 2018-02-16 DIAGNOSIS — S7002XA Contusion of left hip, initial encounter: Secondary | ICD-10-CM | POA: Diagnosis not present
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
## 2018-10-19 LAB — HM DIABETES EYE EXAM

## 2018-10-26 ENCOUNTER — Encounter: Payer: Self-pay | Admitting: Medical

## 2018-11-13 ENCOUNTER — Other Ambulatory Visit: Payer: Self-pay

## 2018-11-13 DIAGNOSIS — Z20822 Contact with and (suspected) exposure to covid-19: Secondary | ICD-10-CM

## 2018-11-15 LAB — NOVEL CORONAVIRUS, NAA: SARS-CoV-2, NAA: DETECTED — AB

## 2018-11-20 ENCOUNTER — Telehealth: Payer: Self-pay | Admitting: *Deleted

## 2018-11-20 NOTE — Telephone Encounter (Signed)
Ellwood Sayers ,RN from [facility] called to verify pt telelphone number

## 2018-11-24 ENCOUNTER — Other Ambulatory Visit: Payer: Self-pay

## 2018-11-24 DIAGNOSIS — Z20822 Contact with and (suspected) exposure to covid-19: Secondary | ICD-10-CM

## 2018-11-26 LAB — NOVEL CORONAVIRUS, NAA: SARS-CoV-2, NAA: NOT DETECTED

## 2019-01-07 IMAGING — CT CT ABD-PELV W/ CM
2 of 6 series · 16 of 46 positions shown, 18 images · IV contrast (ISOVUE 300)
Comparison: None.

CLINICAL DATA: Right lower quadrant pain for several months.
Diabetes.

EXAM:
CT ABDOMEN AND PELVIS WITH CONTRAST
TECHNIQUE: Multidetector CT imaging of the abdomen and pelvis was performed
using the standard protocol following bolus administration of
intravenous contrast.
CONTRAST:  100mL XNPMFA-HEE IOPAMIDOL (XNPMFA-HEE) INJECTION 61%

[Series 2: abd/pel w · axial · 0.61mm/px · z∈[-404,-48]mm · 13 of 81 slices shown, 15 images]
[im 5/81  soft-tissue]
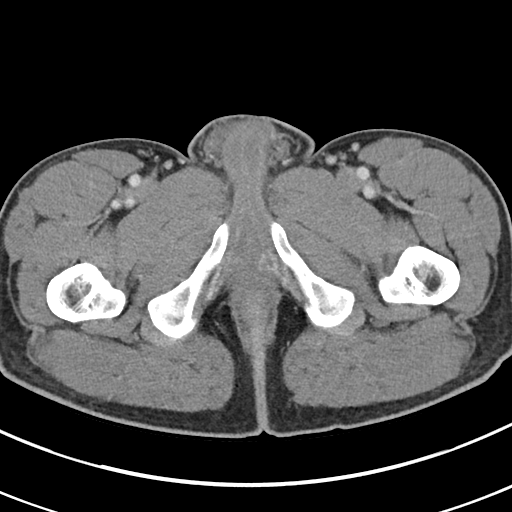
[im 5/81  bone]
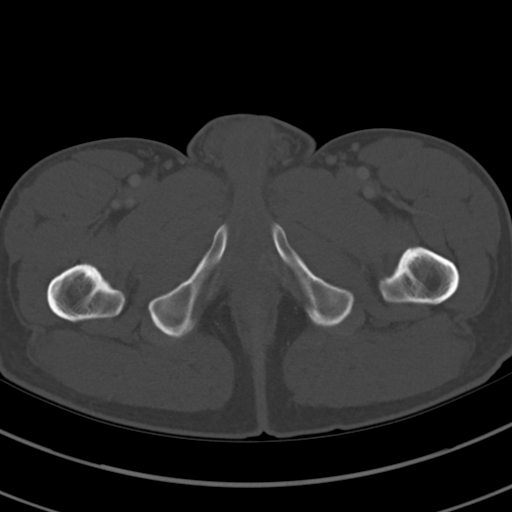
[im 9/81  soft-tissue]
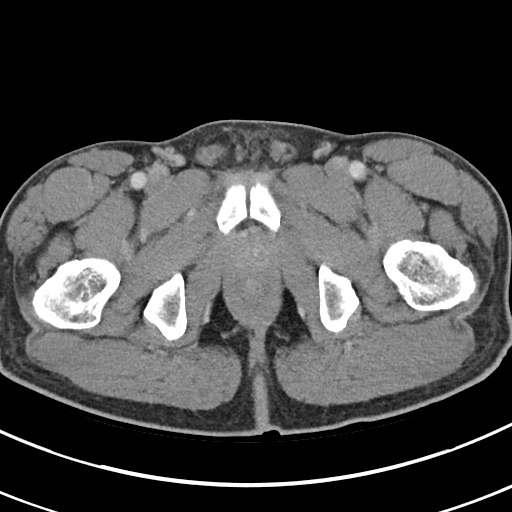
[im 18/81  soft-tissue]
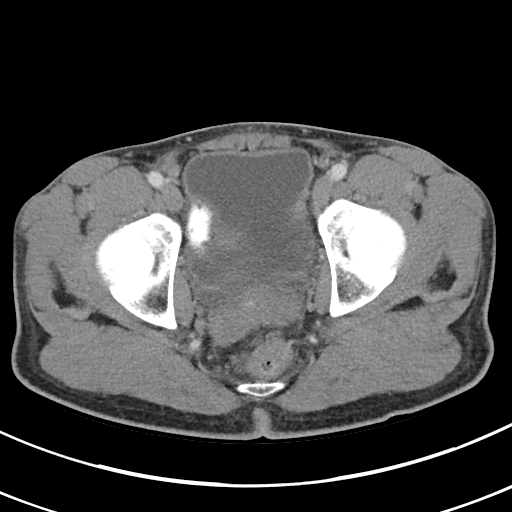
[im 23/81  soft-tissue]
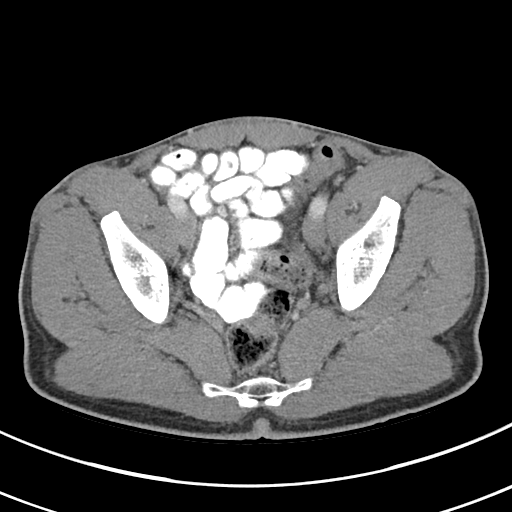
[im 27/81  soft-tissue]
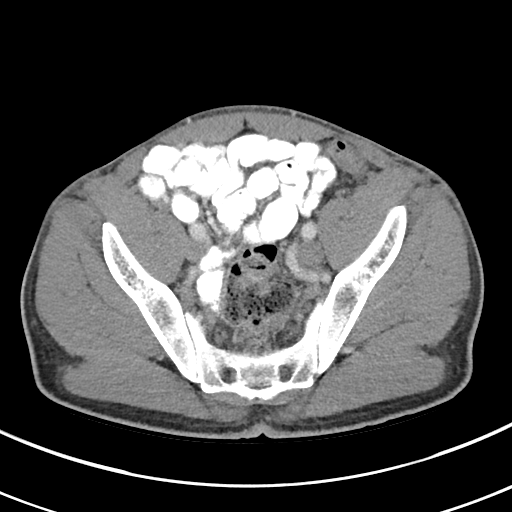
[im 36/81  soft-tissue]
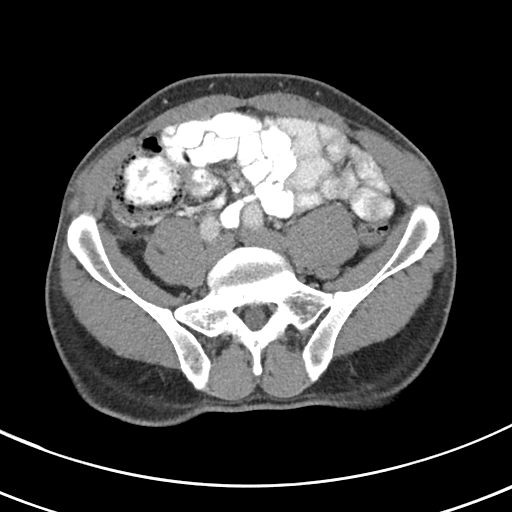
[im 41/81  soft-tissue]
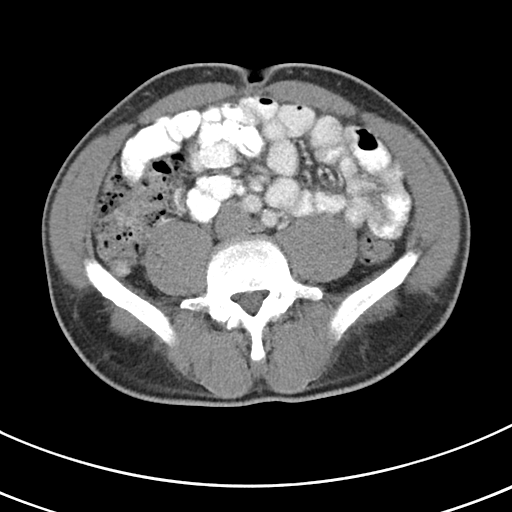
[im 45/81  soft-tissue]
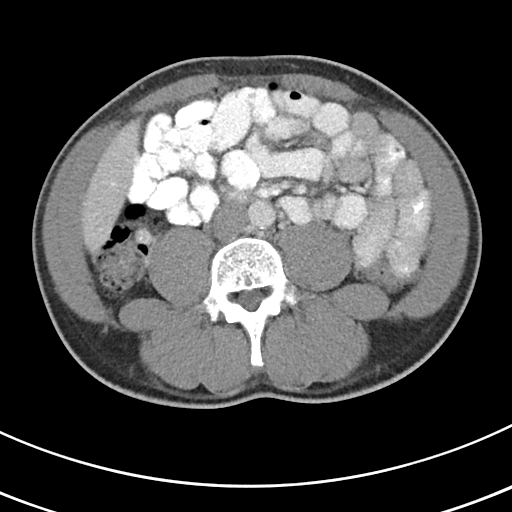
[im 54/81  soft-tissue]
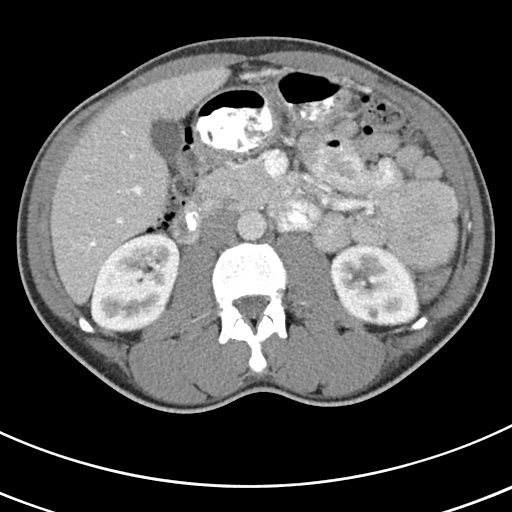
[im 54/81  bone]
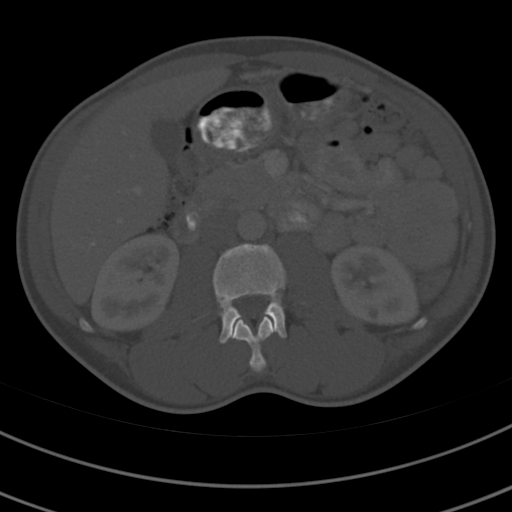
[im 58/81  soft-tissue]
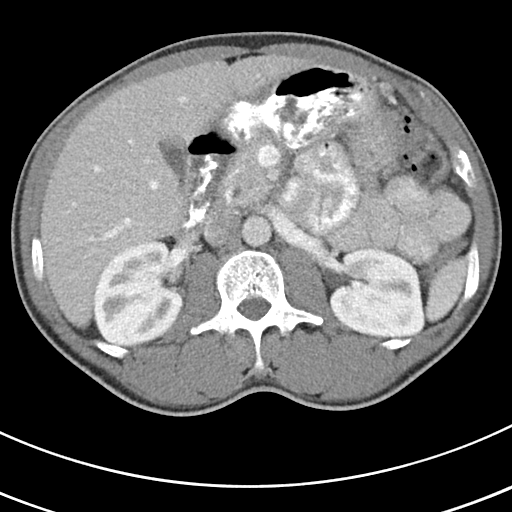
[im 63/81  soft-tissue]
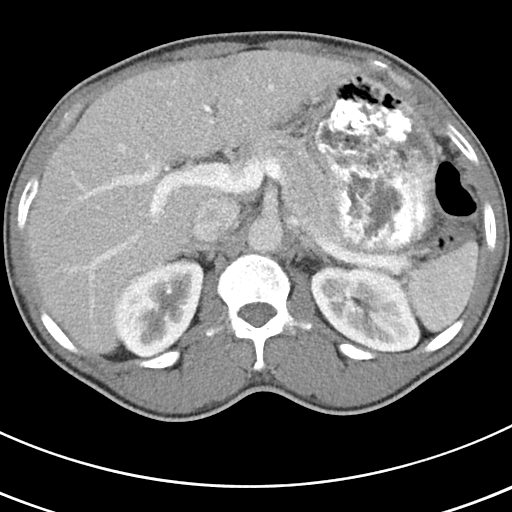
[im 72/81  soft-tissue]
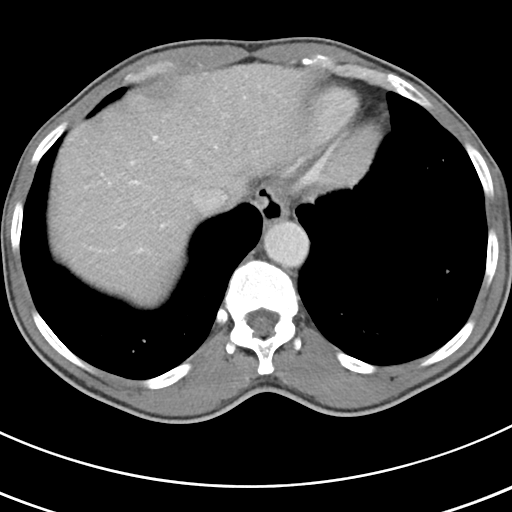
[im 76/81  soft-tissue]
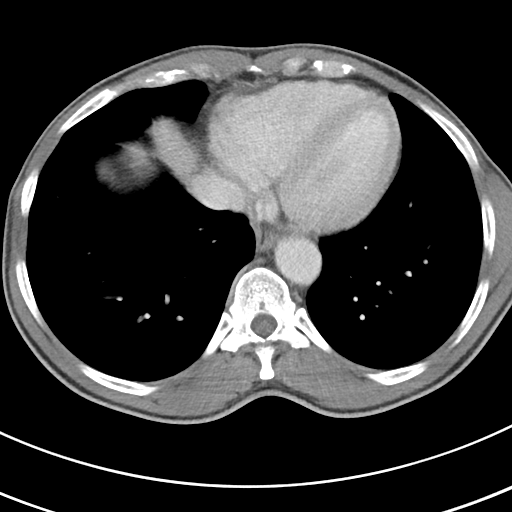

[Series 5: abd/pel w st · coronal · 0.64mm/px · 3 of 76 slices shown]
[im 26/76  soft-tissue]
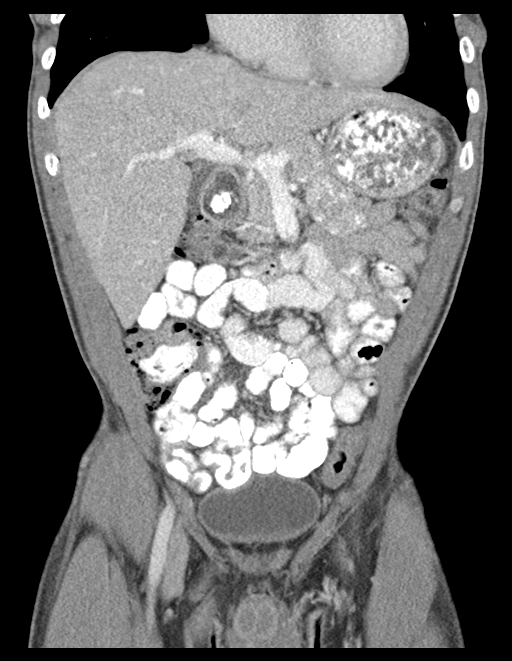
[im 34/76  soft-tissue]
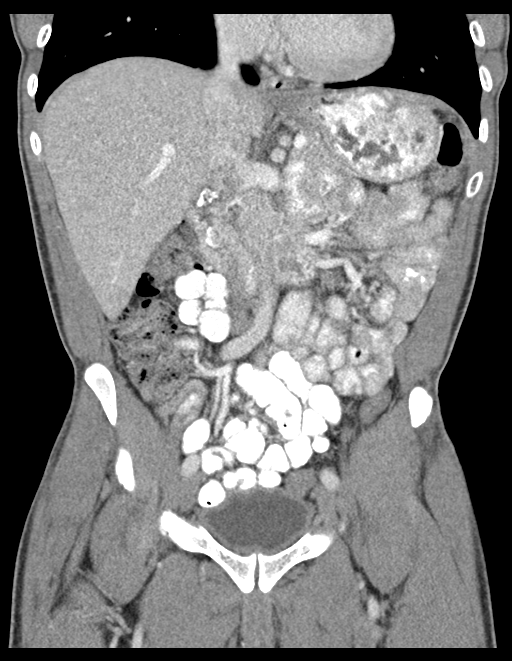
[im 42/76  soft-tissue]
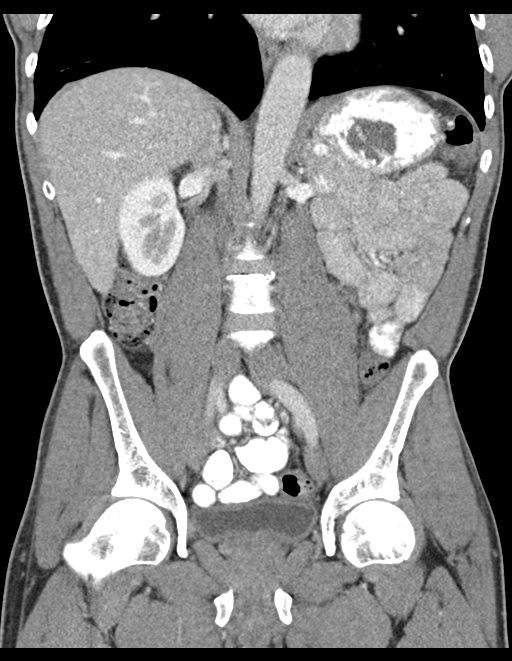

[16 of 46 positions shown; findings below may reference images not displayed]

FINDINGS: Lower Chest: No acute findings.

Hepatobiliary:  No masses identified. Gallbladder is unremarkable.

Pancreas:  No mass or inflammatory changes.

Spleen: Within normal limits in size and appearance.

Adrenals/Urinary Tract: No masses identified. Tiny sub-cm cyst is in
lower pole of left kidney. No evidence of hydronephrosis.
Unremarkable unopacified urinary bladder.

Stomach/Bowel: No evidence of obstruction, inflammatory process or
abnormal fluid collections.

Vascular/Lymphatic: No pathologically enlarged lymph nodes. No
abdominal aortic aneurysm. Mild aortic atherosclerosis.

Reproductive:  No mass or other significant abnormality.

Other:  None.

Musculoskeletal:  No suspicious bone lesions identified.
IMPRESSION: No acute findings within the abdomen or pelvis.

Mild aortic atherosclerosis.

## 2019-03-05 ENCOUNTER — Ambulatory Visit: Payer: BC Managed Care – PPO | Admitting: Medical

## 2019-03-05 ENCOUNTER — Other Ambulatory Visit: Payer: Self-pay | Admitting: Medical

## 2019-03-05 ENCOUNTER — Encounter: Payer: Self-pay | Admitting: Medical

## 2019-03-05 ENCOUNTER — Other Ambulatory Visit: Payer: Self-pay

## 2019-03-05 ENCOUNTER — Ambulatory Visit
Admission: RE | Admit: 2019-03-05 | Discharge: 2019-03-05 | Disposition: A | Discharge status: Home / Self Care | Payer: 59 | Source: Ambulatory Visit | Attending: Medical | Admitting: Medical

## 2019-03-05 VITALS — BP 150/88 | HR 106 | Temp 97.6°F | Ht 69.0 in | Wt 142.4 lb

## 2019-03-05 DIAGNOSIS — M545 Low back pain, unspecified: Secondary | ICD-10-CM

## 2019-03-05 DIAGNOSIS — M549 Dorsalgia, unspecified: Secondary | ICD-10-CM

## 2019-03-05 DIAGNOSIS — M67431 Ganglion, right wrist: Secondary | ICD-10-CM

## 2019-03-05 DIAGNOSIS — L723 Sebaceous cyst: Secondary | ICD-10-CM

## 2019-03-05 DIAGNOSIS — M778 Other enthesopathies, not elsewhere classified: Secondary | ICD-10-CM

## 2019-03-05 DIAGNOSIS — M546 Pain in thoracic spine: Secondary | ICD-10-CM

## 2019-03-05 DIAGNOSIS — E119 Type 2 diabetes mellitus without complications: Secondary | ICD-10-CM | POA: Insufficient documentation

## 2019-03-05 DIAGNOSIS — Z794 Long term (current) use of insulin: Secondary | ICD-10-CM

## 2019-03-05 MED ORDER — METHOCARBAMOL 500 MG PO TABS: 500.0000 mg | ORAL_TABLET | Freq: Every evening | ORAL | 0 refills | Status: DC | PRN

## 2019-03-05 NOTE — Patient Instructions (Signed)
Please go to [facility] for your T and L spine xray.   Their hours are 8am - 4:30 pm Monday - Friday.  Take your insurance card with you.  [facility] [phone]  [address], [place], [place] [address]  [address], [place] [address]    Epidermal Cyst  An epidermal cyst is a small, painless lump under your skin. The cyst contains a grayish-white, bad-smelling substance (keratin). Do not try to pop or open an epidermal cyst yourself. What are the causes?  A blocked hair follicle.  A hair that curls and re-enters the skin instead of growing straight out of the skin.  A blocked pore.  Irritated skin.  An injury to the skin.  Certain conditions that are passed along from parent to child (inherited).  Human papillomavirus (HPV).  Long-term sun damage to the skin. What increases the risk?  Having acne.  Being overweight.  Being 29-22 years old. What are the signs or symptoms? These cysts are usually harmless, but they can get infected. Symptoms of infection may include:  Redness.  Inflammation.  Tenderness.  Warmth.  Fever.  A grayish-white, bad-smelling substance drains from the cyst.  Pus drains from the cyst. How is this treated? In many cases, epidermal cysts go away on their own without treatment. If a cyst becomes infected, treatment may include:  Opening and draining the cyst, done by a doctor. After draining, you may need minor surgery to remove the rest of the cyst.  Antibiotic medicine.  Shots of medicines (steroids) that help to reduce inflammation.  Surgery to remove the cyst. Surgery may be done if the cyst: ? Becomes large. ? Bothers you. ? Has a chance of turning into cancer.  Do not try to open a cyst yourself. Follow these instructions at home:  Take over-the-counter and prescription medicines only as told by your doctor.  If you were prescribed an antibiotic medicine, take it it as told by  your doctor. Do not stop using the antibiotic even if you start to feel better.  Keep the area around your cyst clean and dry.  Wear loose, dry clothing.  Avoid touching your cyst.  Check your cyst every day for signs of infection. Check for: ? Redness, swelling, or pain. ? Fluid or blood. ? Warmth. ? Pus or a bad smell.  Keep all follow-up visits as told by your doctor. This is important. How is this prevented?  Wear clean, dry, clothing.  Avoid wearing tight clothing.  Keep your skin clean and dry. Take showers or baths every day. Contact a doctor if:  Your cyst has symptoms of infection.  Your condition does not improve or gets worse.  You have a cyst that looks different from other cysts you have had.  You have a fever. Get help right away if:  Redness spreads from the cyst into the area close by. Summary  An epidermal cyst is a sac made of skin tissue.  If a cyst becomes infected, treatment may include surgery to open and drain the cyst, or to remove it.  Take over-the-counter and prescription medicines only as told by your doctor.  Contact a doctor if your condition is not improving or is getting worse.  Keep all follow-up visits as told by your doctor. This is important. This information is not intended to replace advice given to you by your health care provider. Make sure you discuss any questions you have with your health care provider.  Document Revised: 04/13/2018 Document Reviewed: 09/29/2017 Elsevier Patient Education  [employer].      Ganglion Cyst  A ganglion cyst is a non-cancerous, fluid-filled lump that occurs near a joint or tendon. The cyst grows out of a joint or the lining of a tendon. Ganglion cysts most often develop in the hand or wrist, but they can also develop in the shoulder, elbow, hip, knee, ankle, or foot. Ganglion cysts are ball-shaped or egg-shaped. Their size can range from the size of a pea to larger than a grape.  Increased activity may cause the cyst to get bigger because more fluid starts to build up. What are the causes? The exact cause of this condition is not known, but it may be related to:  Inflammation or irritation around the joint.  An injury.  Repetitive movements or overuse.  Arthritis. What increases the risk? You are more likely to develop this condition if:  You are a woman.  You are 73-81 years old. What are the signs or symptoms? The main symptom of this condition is a lump. It most often appears on the hand or wrist. In many cases, there are no other symptoms, but a cyst can sometimes cause:  Tingling.  Pain.  Numbness.  Muscle weakness.  Weak grip.  Less range of motion in a joint. How is this diagnosed? Ganglion cysts are usually diagnosed based on a physical exam. Your health care provider will feel the lump and may shine a light next to it. If it is a ganglion cyst, the light will likely shine through it. Your health care provider may order an X-ray, ultrasound, or MRI to rule out other conditions. How is this treated? Ganglion cysts often go away on their own without treatment. If you have pain or other symptoms, treatment may be needed. Treatment is also needed if the ganglion cyst limits your movement or if it gets infected. Treatment may include:  Wearing a brace or splint on your wrist or finger.  Taking anti-inflammatory medicine.  Having fluid drained from the lump with a needle (aspiration).  Getting a steroid injected into the joint.  Having surgery to remove the ganglion cyst.  Placing a pad on your shoe or wearing shoes that will not rub against the cyst if it is on your foot. Follow these instructions at home:  Do not press on the ganglion cyst, poke it with a needle, or hit it.  Take over-the-counter and prescription medicines only as told by your health care provider.  If you have a brace or splint: ? Wear it as told by your health care  provider. ? Remove it as told by your health care provider. Ask if you need to remove it when you take a shower or a bath.  Watch your ganglion cyst for any changes.  Keep all follow-up visits as told by your health care provider. This is important. Contact a health care provider if:  Your ganglion cyst becomes larger or more painful.  You have pus coming from the lump.  You have weakness or numbness in the affected area.  You have a fever or chills. Get help right away if:  You have a fever and have any of these in the cyst area: ? Increased redness. ? Red streaks. ? Swelling. Summary  A ganglion cyst is a non-cancerous, fluid-filled lump that occurs near a joint or tendon.  Ganglion cysts most often develop in the hand or wrist, but they can also develop in  the shoulder, elbow, hip, knee, ankle, or foot.  Ganglion cysts often go away on their own without treatment. This information is not intended to replace advice given to you by your health care provider. Make sure you discuss any questions you have with your health care provider. Document Revised: 12/03/2016 Document Reviewed: 08/20/2016 Elsevier Patient Education  [place].

## 2019-03-05 NOTE — Progress Notes (Signed)
Subjective:  Richard Berg is a 54 y.o. male who presents for Chief Complaint  Patient presents with  . Back Pain    middle of lower back     Here for pain in middle of lower back x 3 weeks.   No injury, no trauma, no fall.  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   Does a lot of twisting and turning.  No fever.  Has had some pain in left thigh occasionally, sometimes pain in left arm, but no numbness or tingling.   No decreased ROM of back.  No unexpected weight loss.   Using some stretching.  No other aggravating or relieving factors.    Gets some pains in both wrists, medial side bilat.  No swelling, no numbness or tingling.  No recent injury  Has cyst on left scalp for months, ganglion cyst on right wrist for over a few years now.  Neither are paretically bothersome.  Had covid back in 11/2018 but that resolved. Was sleeping on a different room, different mattress while quarantine.   No other c/o.  Past Medical History:  Diagnosis Date  . Constipation   . Diabetes mellitus    dx'd 02/15/11  . Family history of premature CAD    father MI in early 59s  . Hepatitis B immune 03/2014   per titer  . Hyperlipidemia   . Positive TB test    "took the treatment ~ 1993"   Current Outpatient Medications on File Prior to Visit  Medication Sig Dispense Refill  . aspirin EC 81 MG tablet Take 1 tablet (81 mg total) by mouth daily. 90 tablet 3  . insulin aspart (NOVOLOG FLEXPEN) 100 UNIT/ML FlexPen Inject 0-15 Units into the skin 3 (three) times daily with meals. 15 mL 3  . Insulin Detemir (LEVEMIR) 100 UNIT/ML Pen Inject 17 Units into the skin daily at 10 pm. 10 pen 5  . Insulin Pen Needle (BD PEN NEEDLE NANO U/F) 32G X 4 MM MISC 1 each by Does not apply route 3 (three) times daily. 100 each 11  . pravastatin (PRAVACHOL) 20 MG tablet Take 1 tablet (20 mg total) by mouth every evening. 90 tablet 2  . sildenafil (VIAGRA) 100 MG tablet 1/2 or 1 tablet as needed per day (Patient not  taking: Reported on 03/05/2019) 10 tablet 5  . sulfamethoxazole-trimethoprim (BACTRIM DS,SEPTRA DS) 800-160 MG tablet      No current facility-administered medications on file prior to visit.     Past Surgical History:  Procedure Laterality Date  . NO PAST SURGERIES       The following portions of the patient's history were reviewed and updated as appropriate: allergies, current medications, past family history, past medical history, past social history, past surgical history and problem list.  ROS Otherwise as in subjective above  Objective: BP (!) 150/88   Pulse (!) 106   Temp 97.6 F (36.4 C)   Ht 5\' 9"  (1.753 m)   Wt 142 lb 6.4 oz (64.6 kg)   SpO2 96%   BMI 21.03 kg/m   General appearance: alert, no distress, well developed, well nourished, lean african [place] male Skin: left scalp parietal region with 2cm raised nontender, brown lesion c/w sebaceous cyst.   Right dorsal wrist with 2cm raised mobile ganglion cyst, otherwise skin unremarkable Abdomen: +bs, soft, non tender, non distended, no masses, no hepatomegaly, no splenomegaly Back: nontender, normal ROM, no deformity, no scoliosis MSK: bilat hips mildly reduced internal and  external ROM, otherwise nontender, no deformity, no swelling, mild tendnerss bilat medial wrists but no swelling, no bony changes, left wrist ROM slight reduced with flexion, otherwise normal exam Legs and arms neurovascularly intact Pulses: 2+ radial pulses, 2+ pedal pulses, normal cap refill Ext: no edema   Assessment: Encounter Diagnoses  Name Primary?  . Acute midline back pain, unspecified back location Yes  . Lumbar back pain   . Lower thoracic back pain   . Sebaceous cyst   . Ganglion cyst of dorsum of right wrist   . Wrist tendonitis   . Insulin dependent type 2 diabetes mellitus ([place])      Plan: Back pain, unexplained - send for xray.  If normal xray, consider muscle relaxer, short term pain medication or NSAID.   Counseled on  stretching, aerobic activity  tendonitis of both wrists - discussed stretching, relative rest, ice therapy, short term NSAID pending back xrays  sebaceous cyst left scalp - he will let me know if he wants referral to dermatology for excision.  Not inflamed or infected  Ganglio cyst of right wrist longstanding - reassured.  He will let me know if it starts giving him problems  Over due for diabetes f/u - advised fasting physical and labs soon.  Richard Berg was seen today for back pain.  Diagnoses and all orders for this visit:  Acute midline back pain, unspecified back location -     DG Lumbar Spine Complete; Future -     DG Thoracic Spine 2 View; Future  Lumbar back pain -     DG Lumbar Spine Complete; Future -     DG Thoracic Spine 2 View; Future  Lower thoracic back pain -     DG Lumbar Spine Complete; Future -     DG Thoracic Spine 2 View; Future  Sebaceous cyst  Ganglion cyst of dorsum of right wrist  Wrist tendonitis  Insulin dependent type 2 diabetes mellitus ([place])    Follow up: pending xray, soon for CPX

## 2019-03-09 ENCOUNTER — Ambulatory Visit: Payer: BC Managed Care – PPO | Attending: Internal Medicine

## 2019-03-09 DIAGNOSIS — Z23 Encounter for immunization: Secondary | ICD-10-CM | POA: Insufficient documentation

## 2019-03-09 NOTE — Progress Notes (Signed)
   Covid-19 Vaccination Clinic  Name:  THORSON MONCRIEFF    MRN: [id number] DOB: October 06, 1965  03/09/2019  Mr. Kleinpeter was observed post Covid-19 immunization for 15 minutes without incident. He was provided with Vaccine Information Sheet and instruction to access the V-Safe system.   Mr. Mather was instructed to call 911 with any severe reactions post vaccine: Marland Kitchen Difficulty breathing  . Swelling of face and throat  . A fast heartbeat  . A bad rash all over body  . Dizziness and weakness

## 2019-04-10 ENCOUNTER — Ambulatory Visit: Payer: BC Managed Care – PPO | Attending: Internal Medicine

## 2019-04-10 DIAGNOSIS — Z23 Encounter for immunization: Secondary | ICD-10-CM

## 2019-04-10 NOTE — Progress Notes (Signed)
   Covid-19 Vaccination Clinic  Name:  Richard Berg    MRN: [id number] DOB: 1965/03/02  04/10/2019  [name] was observed post Covid-19 immunization for 30 minutes based on pre-vaccination screening without incident. He was provided with Vaccine Information Sheet and instruction to access the V-Safe system.   [name] was instructed to call 911 with any severe reactions post vaccine: Marland Kitchen Difficulty breathing  . Swelling of face and throat  . A fast heartbeat  . A bad rash all over body  . Dizziness and weakness   Immunizations Administered    Name Date Dose VIS Date Route   Pfizer COVID-19 Vaccine 04/10/2019  8:17 AM 0.3 mL 12/15/2018 Intramuscular   Manufacturer: [employer], [employer]   Lot: [id number]   [place]: [id number]

## 2019-04-11 ENCOUNTER — Ambulatory Visit (INDEPENDENT_AMBULATORY_CARE_PROVIDER_SITE_OTHER): Payer: BC Managed Care – PPO | Admitting: Medical

## 2019-04-11 ENCOUNTER — Other Ambulatory Visit: Payer: Self-pay

## 2019-04-11 ENCOUNTER — Encounter: Payer: Self-pay | Admitting: Medical

## 2019-04-11 VITALS — BP 116/80 | HR 81 | Temp 98.1°F | Ht 69.0 in | Wt 136.4 lb

## 2019-04-11 DIAGNOSIS — Z789 Other specified health status: Secondary | ICD-10-CM

## 2019-04-11 DIAGNOSIS — Z7185 Encounter for immunization safety counseling: Secondary | ICD-10-CM

## 2019-04-11 DIAGNOSIS — Z0000 Encounter for general adult medical examination without abnormal findings: Secondary | ICD-10-CM | POA: Diagnosis not present

## 2019-04-11 DIAGNOSIS — Z8249 Family history of ischemic heart disease and other diseases of the circulatory system: Secondary | ICD-10-CM

## 2019-04-11 DIAGNOSIS — Z136 Encounter for screening for cardiovascular disorders: Secondary | ICD-10-CM | POA: Diagnosis not present

## 2019-04-11 DIAGNOSIS — Z7189 Other specified counseling: Secondary | ICD-10-CM | POA: Diagnosis not present

## 2019-04-11 DIAGNOSIS — N401 Enlarged prostate with lower urinary tract symptoms: Secondary | ICD-10-CM

## 2019-04-11 DIAGNOSIS — Z794 Long term (current) use of insulin: Secondary | ICD-10-CM

## 2019-04-11 DIAGNOSIS — Z125 Encounter for screening for malignant neoplasm of prostate: Secondary | ICD-10-CM | POA: Diagnosis not present

## 2019-04-11 DIAGNOSIS — E1169 Type 2 diabetes mellitus with other specified complication: Secondary | ICD-10-CM

## 2019-04-11 DIAGNOSIS — Z1211 Encounter for screening for malignant neoplasm of colon: Secondary | ICD-10-CM

## 2019-04-11 DIAGNOSIS — E119 Type 2 diabetes mellitus without complications: Secondary | ICD-10-CM | POA: Diagnosis not present

## 2019-04-11 DIAGNOSIS — K649 Unspecified hemorrhoids: Secondary | ICD-10-CM

## 2019-04-11 NOTE — Progress Notes (Signed)
Subjective:   HPI  Richard Berg is a 54 y.o. male who presents for Chief Complaint  Patient presents with  . Annual Exam    with fasting labs     Patient Care Team: Clorene Nerio, Camelia Eng, PA-C as PCP - General (Family Medicine) Sees dentist Sees eye doctor  Concerns: Some dry and flaky skin at distal foreskin and head of penis without a lot of redness or discomfort  Longstanding hemorrhoids.  No recent constipation.      No recent foot issues.  Lately using long acting insulin prescription 17u nightly and OTC short acting insulin, typically about 3u per meal.   Checking sugars some, between 100-130 fasting.  No foot issues.   Uses aspirin some, not daily.     Reviewed their medical, surgical, family, social, medication, and allergy history and updated chart as appropriate.  Past Medical History:  Diagnosis Date  . Constipation   . Diabetes mellitus    dx'd 02/15/11  . Family history of premature CAD    father MI in early 34s  . Hepatitis B immune 03/2014   per titer  . Hyperlipidemia   . Positive TB test    "took the treatment ~ 1993"    Past Surgical History:  Procedure Laterality Date  . COLONOSCOPY  2011    Social History   Socioeconomic History  . Marital status: Married    Spouse name: Not on file  . Number of children: Not on file  . Years of education: Not on file  . Highest education level: Not on file  Occupational History  . Not on file  Tobacco Use  . Smoking status: Current Some Day Smoker    Packs/day: 0.50    Years: 20.00    Pack years: 10.00    Types: Cigarettes, Cigars    Last attempt to quit: 12/02/2009    Years since quitting: 9.3  . Smokeless tobacco: Former Systems developer    Types: Chew  . Tobacco comment: cigars currently 2021  Substance and Sexual Activity  . Alcohol use: Yes    Alcohol/week: 10.0 standard drinks    Types: 10 Shots of liquor per week  . Drug use: Not Currently    Types: Marijuana    Comment: marijuana "last time ~  1987"  . Sexual activity: Yes  Other Topics Concern  . Not on file  Social History Narrative   Married, 1 child, works in Actor for hospitals.    04/2019   Social Determinants of Health   Financial Resource Strain:   . Difficulty of Paying Living Expenses:   Food Insecurity:   . Worried About Charity fundraiser in the Last Year:   . Arboriculturist in the Last Year:   Transportation Needs:   . Film/video editor (Medical):   Marland Kitchen Lack of Transportation (Non-Medical):   Physical Activity:   . Days of Exercise per Week:   . Minutes of Exercise per Session:   Stress:   . Feeling of Stress :   Social Connections:   . Frequency of Communication with Friends and Family:   . Frequency of Social Gatherings with Friends and Family:   . Attends Religious Services:   . Active Member of Clubs or Organizations:   . Attends Archivist Meetings:   Marland Kitchen Marital Status:   Intimate Partner Violence:   . Fear of Current or Ex-Partner:   . Emotionally Abused:   Marland Kitchen Physically Abused:   .  Sexually Abused:     Family History  Problem Relation Age of Onset  . Cancer Mother        breast  . Hypertension Sister   . Heart disease Brother 72       stent  . Hypertension Father   . Diabetes type II Paternal Grandmother      Current Outpatient Medications:  .  aspirin EC 81 MG tablet, Take 1 tablet (81 mg total) by mouth daily., Disp: 90 tablet, Rfl: 3 .  insulin aspart (NOVOLOG FLEXPEN) 100 UNIT/ML FlexPen, Inject 0-15 Units into the skin 3 (three) times daily with meals., Disp: 15 mL, Rfl: 3 .  Insulin Detemir (LEVEMIR) 100 UNIT/ML Pen, Inject 17 Units into the skin daily at 10 pm., Disp: 10 pen, Rfl: 5 .  Insulin Pen Needle (BD PEN NEEDLE NANO U/F) 32G X 4 MM MISC, 1 each by Does not apply route 3 (three) times daily., Disp: 100 each, Rfl: 11 .  methocarbamol (ROBAXIN) 500 MG tablet, Take 1 tablet (500 mg total) by mouth at bedtime as needed for muscle spasms. (Patient not  taking: Reported on 04/11/2019), Disp: 10 tablet, Rfl: 0 .  pravastatin (PRAVACHOL) 20 MG tablet, Take 1 tablet (20 mg total) by mouth every evening., Disp: 90 tablet, Rfl: 2 .  sildenafil (VIAGRA) 100 MG tablet, 1/2 or 1 tablet as needed per day (Patient not taking: Reported on 03/05/2019), Disp: 10 tablet, Rfl: 5 .  sulfamethoxazole-trimethoprim (BACTRIM DS,SEPTRA DS) 800-160 MG tablet, , Disp: , Rfl:   Allergies  Allergen Reactions  . Lisinopril Swelling    Lip swelling  . Penicillins Hives       Review of Systems Constitutional: -fever, -chills, -sweats, -unexpected weight change, -decreased appetite, -fatigue Allergy: -sneezing, +itching, -congestion Dermatology: -changing moles, --rash, -lumps ENT: -runny nose, -ear pain, -sore throat, -hoarseness, -sinus pain, -teeth pain, - ringing in ears, -hearing loss, -nosebleeds Cardiology: -chest pain, -palpitations, -swelling, -difficulty breathing when lying flat, -waking up short of breath Respiratory: -cough, -shortness of breath, -difficulty breathing with exercise or exertion, -wheezing, -coughing up blood Gastroenterology: -abdominal pain, -nausea, -vomiting, -diarrhea, -constipation, -blood in stool, -changes in bowel movement, -difficulty swallowing or eating Hematology: -bleeding, -bruising  Musculoskeletal: -joint aches, -muscle aches, -joint swelling, -back pain, -neck pain, -cramping, -changes in gait Ophthalmology: denies vision changes, eye redness, itching, discharge Urology: -burning with urination, -difficulty urinating, -blood in urine, -urinary frequency, -urgency, -incontinence Neurology: -headache, -weakness, -tingling, -numbness, -memory loss, -falls, -dizziness Psychology: -depressed mood, -agitation, -sleep problems Male GU: no testicular mass, pain, no lymph nodes swollen, no swelling, +rash     Objective:  BP 116/80   Pulse 81   Temp 98.1 F (36.7 C)   Ht 5\' 9"  (1.753 m)   Wt 136 lb 6.4 oz (61.9 kg)   SpO2  97%   BMI 20.14 kg/m   General appearance: alert, no distress, WD/WN, African American male Skin: dry flaking skin distal shaft of penis and gland penis, no other rash, some nail pitting in fingernails Neck: supple, no lymphadenopathy, no thyromegaly, no masses, normal ROM, no bruits Chest: non tender, normal shape and expansion Heart: RRR, normal S1, S2, no murmurs Lungs: CTA bilaterally, no wheezes, rhonchi, or rales Abdomen: +bs, soft, non tender, non distended, no masses, no hepatomegaly, no splenomegaly, no bruits Back: non tender, normal ROM, no scoliosis Musculoskeletal: upper extremities non tender, no obvious deformity, normal ROM throughout, lower extremities non tender, no obvious deformity, normal ROM throughout Extremities: no edema, no cyanosis,  no clubbing Pulses: 2+ symmetric, upper and lower extremities, normal cap refill Neurological: alert, oriented x 3, CN2-12 intact, strength normal upper extremities and lower extremities, sensation normal throughout, DTRs 2+ throughout, no cerebellar signs, gait normal Psychiatric: normal affect, behavior normal, pleasant  GU: normal male external genitalia,circumcised, nontender, no masses, no hernia, no lymphadenopathy Rectal: hemorrhoid and skin tags present, moderate, prostate mildly enlarged, no nodules  Diabetic Foot Exam - Simple   Simple Foot Form Visual Inspection No deformities, no ulcerations, no other skin breakdown bilaterally: Yes Sensation Testing Intact to touch and monofilament testing bilaterally: Yes Pulse Check Posterior Tibialis and Dorsalis pulse intact bilaterally: Yes Comments      Assessment and Plan :   Encounter Diagnoses  Name Primary?  . Routine general medical examination at a health care facility Yes  . Insulin dependent type 2 diabetes mellitus ([place])   . Hepatitis B immune   . Vaccine counseling   . Screen for colon cancer   . Screening for prostate cancer   . Family history of premature  CAD   . Type 2 diabetes mellitus with other specified complication, with long-term current use of insulin ([place])   . Benign prostatic hyperplasia with lower urinary tract symptoms, symptom details unspecified   . Screening for heart disease   . Hemorrhoids, unspecified hemorrhoid type     Physical exam - discussed and counseled on healthy lifestyle, diet, exercise, preventative care, vaccinations, sick and well care, proper use of emergency dept and after hours care, and addressed their concerns.    Health screening: See your eye doctor yearly for routine vision care. See your dentist yearly for routine dental care including hygiene visits twice yearly.  Cancer screening Colonoscopy:  Referred for screening colonoscopy  Discussed PSA, prostate exam, and prostate cancer screening risks/benefits.      Vaccinations: Advised yearly influenza vaccine, pneumococcal 23, Shingrix, and Td.  He will consider.  He is up to date on Covid vaccine.   Acute issues discussed: Rash of penis - failed OTC lotion.  Begin 7-10 days of hydrocortisone cream OTC.   If not resolving call back for Lotrisone trial  hemorrhoid and rectal skin tag - he will discuss with Dr. Benson Norway when he goes for colonoscopy    Separate significant chronic issues discussed: Diabetes - advised more frequent f/u, screening.   Counseled on diet, exercise, glucometer testing, daily foot exam, yearly eye doctor f/u  BPH - PSA level     Kaulin was seen today for annual exam.  Diagnoses and all orders for this visit:  Routine general medical examination at a health care facility -     Ambulatory referral to Gastroenterology -     Comprehensive metabolic panel -     CBC with Differential/Platelet -     Lipid panel -     Hemoglobin A1c -     Microalbumin/Creatinine Ratio, Urine -     PSA  Insulin dependent type 2 diabetes mellitus ([facility]) -     Comprehensive metabolic panel -     Lipid panel -     Hemoglobin A1c -      Microalbumin/Creatinine Ratio, Urine  Hepatitis B immune  Vaccine counseling  Screen for colon cancer -     Ambulatory referral to Gastroenterology  Screening for prostate cancer -     PSA  Family history of premature CAD  Type 2 diabetes mellitus with other specified complication, with long-term current use of insulin ([facility])  Benign prostatic hyperplasia  with lower urinary tract symptoms, symptom details unspecified -     PSA  Screening for heart disease -     Lipid panel  Hemorrhoids, unspecified hemorrhoid type    Follow-up pending labs, yearly for physical

## 2019-04-11 NOTE — Patient Instructions (Signed)
Check insurnace coverage for Shingrix vaccine, Tetanus diptheria booster, and Pneumococcal 23 vaccine.  I recommend each of these.     Shingrix is 2 shots, 2 months apart  Tetanus is every 10 years  Pneumococcal 23 is a one time vaccine   We will refer you back to Dr. Benson Norway for updated colonopscy and hemorrhoid evaluation   Follow up at least every 6 months on diabetes.    Preventative Care for Adults - Male    Thank you for coming in for your well visit today, and thank you for trusting [place] with your care!   Maintain regular health and wellness exams:  A routine yearly physical is a good way to check in with your primary care provider about your health and preventive screening. It is also an opportunity to share updates about your health and any concerns you have, and receive a thorough all-over exam.   Most health insurance companies pay for at least some preventative services.  Check with your health plan for specific coverages.  What preventative services do men need?  Adult men should have their weight and blood pressure checked regularly.   Men age 62 and older should have their cholesterol levels checked regularly.  Beginning at age 47 and continuing to age 24, men should be screened for colorectal cancer.  Certain people may need continued testing until age 42.  Updating vaccinations is part of preventative care.  Vaccinations help protect against diseases such as the flu.  Osteoporosis is a disease in which the bones lose minerals and strength as we age. Men ages 62 and over should discuss this with their caregivers  Lab tests are generally done as part of preventative care to screen for anemia and blood disorders, to screen for problems with the kidneys and liver, to screen for bladder problems, to check blood sugar, and to check your cholesterol level.  Preventative services generally include counseling about diet, exercise, avoiding tobacco, drugs, excessive alcohol  consumption, and sexually transmitted infections.   Xrays and CT scans are not normally done as a preventative test, and most insurances do not pay for imaging for screening other than as discussed under cancer screens below.   On the other hand, if you have certain medical concerns, imaging may be necessary as a diagnostic test.    Your Medical Team Your medical team starts with [place], your PCP or primary care provider.  Please use our services for your routine care such as physicals, screenings, immunizations, sick visits, and your first stop for general medical concerns.  You can call our number for after hours information for urgent questions that may need attention but cannot wait til the next business day.    Urgent care-urgent cares exist to provide care when your primary care office would typically be closed such as evenings or weekends.   Urgent care is for evaluation of urgent medical problems that do not necessarily require emergency department care, but cannot wait til the next business day when we are open.  Emergency department care-please reserve emergency department care for serious, urgent, possibly life-threatening medical problems.  This includes issues like possible stroke, heart attack, significant injury, mental health crisis, or other urgent need that requires immediate medical attention.     See your dentist office twice yearly for hygiene and cleaning visits.   Brush your teeth and floss your teeth daily.  See your eye doctor yearly for routine eye exam and screenings for glaucoma and retinal disease.  Vaccines:  Stay up to date with your tetanus shots and other required immunizations. You should have a booster for tetanus every 10 years. Be sure to get your flu shot every year, since 5%-20% of the [place] population comes down with the flu. The flu vaccine changes each year, so being vaccinated once is not enough. Get your shot in the fall, before the flu season peaks.    Other vaccines to consider:  Pneumococcal vaccine to protect against certain types of pneumonia.  This is normally recommended for adults age 62 or older.  However, adults younger than 53 years old with certain underlying conditions such as diabetes, heart or lung disease should also receive the vaccine.  Shingles vaccine to protect against Varicella Zoster if you are older than age 45, or younger than 53 years old with certain underlying illness.  If you have not had the Shingrix vaccine, please call your insurer to inquire about coverage for the Shingrix vaccine given in 2 doses.   Some insurers cover this vaccine after age 49, some cover this after age 50.  If your insurer covers this, then call to schedule appointment to have this vaccine here  Hepatitis A vaccine to protect against a form of infection of the liver by a virus acquired from food.  Hepatitis B vaccine to protect against a form of infection of the liver by a virus acquired from blood or body fluids, particularly if you work in health care.  If you plan to travel internationally, check with your local health department for specific vaccination recommendations.  Human Papilloma Virus or HPV causes cancer of the cervix, and other infections that can be transmitted from person to person. There is a vaccine for HPV, and males should get immunized between the ages of 27 and 56. It requires a series of 3 shots.   Covid/Coronavirus - as the vaccines are becoming available, please consider vaccination if you are a health care worker, first responder, or have significant health problems such as asthma, COPD, heart disease, hypertension, diabetes, obesity, multiple medical problems, over age 11yo, or immunocompromised.       What should I know about Cancer screening? Many types of cancers can be detected early and may often be prevented. Lung Cancer  You should be screened every year for lung cancer if: ? You are a current smoker who  has smoked for at least 30 years. ? You are a former smoker who has quit within the past 15 years.  Talk to your health care provider about your screening options, when you should start screening, and how often you should be screened.  Colorectal Cancer  Routine colorectal cancer screening usually begins at 53 years of age and should be repeated every 5-10 years until you are 53 years old. You may need to be screened more often if early forms of precancerous polyps or small growths are found. Your health care provider may recommend screening at an earlier age if you have risk factors for colon cancer.  Your health care provider may recommend using home test kits to check for hidden blood in the stool.  A small camera at the end of a tube can be used to examine your colon (sigmoidoscopy or colonoscopy). This checks for the earliest forms of colorectal cancer.  Prostate and Testicular Cancer  Depending on your age and overall health, your health care provider may do certain tests to screen for prostate and testicular cancer.  Talk to your health care provider  about any symptoms or concerns you have about testicular or prostate cancer.  Skin Cancer  Check your skin from head to toe regularly.  Tell your health care provider about any new moles or changes in moles, especially if: ? There is a change in a mole's size, shape, or color. ? You have a mole that is larger than a pencil eraser.  Always use sunscreen. Apply sunscreen liberally and repeat throughout the day.  Protect yourself by wearing long sleeves, pants, a wide-brimmed hat, and sunglasses when outside.    GENERAL RECOMMENDATIONS FOR GOOD HEALTH:  Healthy diet:  Eat a variety of foods, including fruit, vegetables, animal or vegetable protein, such as meat, fish, chicken, and eggs, or beans, lentils, tofu, and grains, such as rice.  Drink plenty of water daily.  Decrease saturated fat in the diet, avoid lots of red meat,  processed foods, sweets, fast foods, and fried foods.  Exercise:  Aerobic exercise helps maintain good heart health. At least 30-40 minutes of moderate-intensity exercise is recommended. For example, a brisk walk that increases your heart rate and breathing. This should be done on most days of the week.   Find a type of exercise or a variety of exercises that you enjoy so that it becomes a part of your daily life.  Examples are running, walking, swimming, water aerobics, and biking.  For motivation and support, explore group exercise such as aerobic class, spin class, Zumba, Yoga,or  martial arts, etc.    Set exercise goals for yourself, such as a certain weight goal, walk or run in a race such as a 5k walk/run.  Speak to your primary care provider about exercise goals.  Your weight readings per our records: Wt Readings from Last 3 Encounters:  04/11/19 136 lb 6.4 oz (61.9 kg)  03/05/19 142 lb 6.4 oz (64.6 kg)  02/16/18 144 lb 3.2 oz (65.4 kg)    Body mass index is 20.14 kg/m.    Disease prevention:  If you smoke or chew tobacco, find out from your caregiver how to quit. It can literally save your life, no matter how long you have been a tobacco user. If you do not use tobacco, never begin.   Maintain a healthy diet and normal weight. Increased weight leads to problems with blood pressure and diabetes.   The Body Mass Index or BMI is a way of measuring how much of your body is fat. Having a BMI above 27 increases the risk of heart disease, diabetes, hypertension, stroke and other problems related to obesity. Your caregiver can help determine your BMI and based on it develop an exercise and dietary program to help you achieve or maintain this important measurement at a healthful level.  High blood pressure causes heart and blood vessel problems.  Persistent high blood pressure should be treated with medicine if weight loss and exercise do not work.  Your blood pressure readings per our  records:     BP Readings from Last 3 Encounters:  04/11/19 116/80  03/05/19 (!) 150/88  02/16/18 120/80     Fat and cholesterol leaves deposits in your arteries that can block them. This causes heart disease and vessel disease elsewhere in your body.  If your cholesterol is found to be high, or if you have heart disease or certain other medical conditions, then you may need to have your cholesterol monitored frequently and be treated with medication.   Ask if you should have a cardiac stress test if your  history suggests this. A stress test is a test done on a treadmill that looks for heart disease. This test can find disease prior to there being a problem.   Osteoporosis is a disease in which the bones lose minerals and strength as we age. This can result in serious bone fractures. Risk of osteoporosis can be identified using a bone density scan. Men ages 25 and over should discuss this with their caregivers. Ask your caregiver whether you should be taking a calcium supplement and Vitamin D, to reduce the rate of osteoporosis.   Avoid drinking alcohol in excess (more than two drinks per day).  Avoid use of street drugs. Do not share needles with anyone. Ask for professional help if you need assistance or instructions on stopping the use of alcohol, cigarettes, and/or drugs.  Brush your teeth twice a day with fluoride toothpaste, and floss once a day. Good oral hygiene prevents tooth decay and gum disease. The problems can be painful, unattractive, and can cause other health problems. Visit your dentist for a routine oral and dental check up and preventive care every 6-12 months.   Safety:  Use seatbelts 100% of the time, whether driving or as a passenger.  Use safety devices such as hearing protection if you work in environments with loud noise or significant background noise.  Use safety glasses when doing any work that could send debris in to the eyes.  Use a helmet if you ride a bike or  motorcycle.  Use appropriate safety gear for contact sports.  Talk to your caregiver about gun safety.  Use sunscreen with a SPF (or skin protection factor) of 15 or greater.  Lighter skinned people are at a greater risk of skin cancer. Don't forget to also wear sunglasses in order to protect your eyes from too much damaging sunlight. Damaging sunlight can accelerate cataract formation.   Keep carbon monoxide and smoke detectors in your home functioning at all times. Change the batteries every 6 months or use a model that plugs into the wall.    Sexual activity: . Sex is a normal part of life and sexual activity can continue into older adulthood for many healthy people.   . If you are having erectile dysfunction issues, please follow up to discuss this further.   . If you are not in a monogamous relationship or have more than one partner, please practice safe sex.  Use condoms. Condoms are used for birth control and to help reduce the spread of sexually transmitted infections (or STIs).  Some of the STIs are gonorrhea (the clap), chlamydia, syphilis, trichomonas, herpes, HPV (human papilloma virus) and HIV (human immunodeficiency virus) which causes AIDS. The herpes, HIV and HPV are viral illnesses that have no cure. These can result in disability, cancer and death.   We are able to test for STIs here at our office.

## 2019-04-12 ENCOUNTER — Other Ambulatory Visit: Payer: Self-pay | Admitting: Medical

## 2019-04-12 LAB — CBC WITH DIFFERENTIAL/PLATELET
Basophils Absolute: 0 10*3/uL (ref 0.0–0.2)
Basos: 0 %
EOS (ABSOLUTE): 0 10*3/uL (ref 0.0–0.4)
Eos: 1 %
Hematocrit: 44.1 % (ref 37.5–51.0)
Hemoglobin: 14.7 g/dL (ref 13.0–17.7)
Immature Grans (Abs): 0 10*3/uL (ref 0.0–0.1)
Immature Granulocytes: 0 %
Lymphocytes Absolute: 1.1 10*3/uL (ref 0.7–3.1)
Lymphs: 22 %
MCH: 30.7 pg (ref 26.6–33.0)
MCHC: 33.3 g/dL (ref 31.5–35.7)
MCV: 92 fL (ref 79–97)
Monocytes Absolute: 0.7 10*3/uL (ref 0.1–0.9)
Monocytes: 13 %
Neutrophils Absolute: 3.3 10*3/uL (ref 1.4–7.0)
Neutrophils: 64 %
Platelets: 261 10*3/uL (ref 150–450)
RBC: 4.79 x10E6/uL (ref 4.14–5.80)
RDW: 12.4 % (ref 11.6–15.4)
WBC: 5.1 10*3/uL (ref 3.4–10.8)

## 2019-04-12 LAB — MICROALBUMIN / CREATININE URINE RATIO
Creatinine, Urine: 85.4 mg/dL
Microalb/Creat Ratio: 11 mg/g creat (ref 0–29)
Microalbumin, Urine: 9.3 ug/mL

## 2019-04-12 LAB — LIPID PANEL
Chol/HDL Ratio: 2.1 ratio (ref 0.0–5.0)
Cholesterol, Total: 216 mg/dL — ABNORMAL HIGH (ref 100–199)
HDL: 104 mg/dL (ref >39)
LDL Chol Calc (NIH): 104 mg/dL — ABNORMAL HIGH (ref 0–99)
Triglycerides: 45 mg/dL (ref 0–149)
VLDL Cholesterol Cal: 8 mg/dL (ref 5–40)

## 2019-04-12 LAB — COMPREHENSIVE METABOLIC PANEL
ALT: 13 IU/L (ref 0–44)
AST: 18 IU/L (ref 0–40)
Albumin/Globulin Ratio: 1.9 (ref 1.2–2.2)
Albumin: 4.9 g/dL (ref 3.8–4.9)
Alkaline Phosphatase: 74 IU/L (ref 39–117)
BUN/Creatinine Ratio: 13 (ref 9–20)
BUN: 11 mg/dL (ref 6–24)
Bilirubin Total: 0.4 mg/dL (ref 0.0–1.2)
CO2: 26 mmol/L (ref 20–29)
Calcium: 10.1 mg/dL (ref 8.7–10.2)
Chloride: 102 mmol/L (ref 96–106)
Creatinine, Ser: 0.86 mg/dL (ref 0.76–1.27)
GFR calc Af Amer: 115 mL/min/{1.73_m2} (ref >59)
GFR calc non Af Amer: 100 mL/min/{1.73_m2} (ref >59)
Globulin, Total: 2.6 g/dL (ref 1.5–4.5)
Glucose: 99 mg/dL (ref 65–99)
Potassium: 5.1 mmol/L (ref 3.5–5.2)
Sodium: 140 mmol/L (ref 134–144)
Total Protein: 7.5 g/dL (ref 6.0–8.5)

## 2019-04-12 LAB — HEMOGLOBIN A1C
Est. average glucose Bld gHb Est-mCnc: 128 mg/dL
Hgb A1c MFr Bld: 6.1 % — ABNORMAL HIGH (ref 4.8–5.6)

## 2019-04-12 LAB — PSA: Prostate Specific Ag, Serum: 0.8 ng/mL (ref 0.0–4.0)

## 2019-04-12 MED ORDER — ASPIRIN EC 81 MG PO TBEC: 81.0000 mg | DELAYED_RELEASE_TABLET | Freq: Every day | ORAL | 3 refills | Status: DC

## 2019-04-12 MED ORDER — INSULIN DETEMIR 100 UNIT/ML FLEXPEN: 17.0000 [IU] | PEN_INJECTOR | Freq: Every day | SUBCUTANEOUS | 11 refills | Status: DC

## 2019-04-12 MED ORDER — NOVOLOG FLEXPEN 100 UNIT/ML ~~LOC~~ SOPN: 0.0000 [IU] | PEN_INJECTOR | Freq: Three times a day (TID) | SUBCUTANEOUS | 3 refills | Status: DC

## 2019-04-12 MED ORDER — BD PEN NEEDLE NANO U/F 32G X 4 MM MISC: 1.0000 | Freq: Three times a day (TID) | 11 refills | Status: DC

## 2019-04-12 MED ORDER — ROSUVASTATIN CALCIUM 10 MG PO TABS: 10.0000 mg | ORAL_TABLET | Freq: Every day | ORAL | 3 refills | Status: DC

## 2019-05-16 ENCOUNTER — Encounter: Payer: Self-pay | Admitting: Medical

## 2019-08-10 DIAGNOSIS — Z20822 Contact with and (suspected) exposure to covid-19: Secondary | ICD-10-CM | POA: Diagnosis not present

## 2019-08-10 DIAGNOSIS — Z03818 Encounter for observation for suspected exposure to other biological agents ruled out: Secondary | ICD-10-CM | POA: Diagnosis not present

## 2019-12-17 DIAGNOSIS — Z20822 Contact with and (suspected) exposure to covid-19: Secondary | ICD-10-CM | POA: Diagnosis not present

## 2019-12-18 ENCOUNTER — Other Ambulatory Visit: Payer: Self-pay

## 2019-12-18 ENCOUNTER — Ambulatory Visit (INDEPENDENT_AMBULATORY_CARE_PROVIDER_SITE_OTHER): Payer: BC Managed Care – PPO

## 2019-12-18 DIAGNOSIS — Z23 Encounter for immunization: Secondary | ICD-10-CM | POA: Diagnosis not present

## 2020-04-02 LAB — HM DIABETES EYE EXAM

## 2020-04-11 ENCOUNTER — Encounter: Payer: Self-pay | Admitting: Medical

## 2020-04-11 ENCOUNTER — Encounter: Payer: BC Managed Care – PPO | Admitting: Medical

## 2020-05-05 ENCOUNTER — Other Ambulatory Visit: Payer: Self-pay

## 2020-05-05 ENCOUNTER — Ambulatory Visit: Payer: BC Managed Care – PPO | Admitting: Medical

## 2020-05-05 ENCOUNTER — Encounter: Payer: Self-pay | Admitting: Medical

## 2020-05-05 VITALS — BP 120/82 | HR 84 | Ht 69.0 in | Wt 142.0 lb

## 2020-05-05 DIAGNOSIS — Z7185 Encounter for immunization safety counseling: Secondary | ICD-10-CM

## 2020-05-05 DIAGNOSIS — Z23 Encounter for immunization: Secondary | ICD-10-CM | POA: Insufficient documentation

## 2020-05-05 DIAGNOSIS — Z125 Encounter for screening for malignant neoplasm of prostate: Secondary | ICD-10-CM

## 2020-05-05 DIAGNOSIS — Z789 Other specified health status: Secondary | ICD-10-CM | POA: Diagnosis not present

## 2020-05-05 DIAGNOSIS — E785 Hyperlipidemia, unspecified: Secondary | ICD-10-CM

## 2020-05-05 DIAGNOSIS — Z1211 Encounter for screening for malignant neoplasm of colon: Secondary | ICD-10-CM | POA: Insufficient documentation

## 2020-05-05 DIAGNOSIS — E1165 Type 2 diabetes mellitus with hyperglycemia: Secondary | ICD-10-CM | POA: Diagnosis not present

## 2020-05-05 DIAGNOSIS — Z8249 Family history of ischemic heart disease and other diseases of the circulatory system: Secondary | ICD-10-CM | POA: Diagnosis not present

## 2020-05-05 DIAGNOSIS — I700 Atherosclerosis of aorta: Secondary | ICD-10-CM

## 2020-05-05 DIAGNOSIS — Z566 Other physical and mental strain related to work: Secondary | ICD-10-CM | POA: Insufficient documentation

## 2020-05-05 DIAGNOSIS — N401 Enlarged prostate with lower urinary tract symptoms: Secondary | ICD-10-CM

## 2020-05-05 DIAGNOSIS — Z0000 Encounter for general adult medical examination without abnormal findings: Secondary | ICD-10-CM | POA: Diagnosis not present

## 2020-05-05 DIAGNOSIS — Z136 Encounter for screening for cardiovascular disorders: Secondary | ICD-10-CM

## 2020-05-05 NOTE — Progress Notes (Signed)
Subjective:   HPI  Richard Berg is a 55 y.o. male who presents for Chief Complaint  Patient presents with  . Annual Exam    Cpe with fasting labs     Patient Care Team: Elianis Fischbach, Camelia Eng, PA-C as PCP - General (Family Medicine) Sees dentist Sees eye doctor Dr. Carol Ada , GI  Concerns: Diabetes- for the last month has used no insulin, but prior to 2 months ago was using 15 units nightly of Levemir.  He only uses mealtime insulin if greater than 130 fasting and he will use sliding scale at that point.  Blood sugars have been running in goal range.  No foot lesions no other symptoms  Hyperlipidemia-he has not been taking Crestor  He has been under a lot of stress, work has been very stressful for the last several years.  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  Past Medical History:  Diagnosis Date  . Constipation   . Diabetes mellitus    dx'd 02/15/11  . Family history of premature CAD    father MI in early 72s  . Hepatitis B immune 03/2014   per titer  . Hyperlipidemia   . Positive TB test    "took the treatment ~ 1993"    Past Surgical History:  Procedure Laterality Date  . COLONOSCOPY  2011    Social History   Socioeconomic History  . Marital status: Married    Spouse name: Not on file  . Number of children: Not on file  . Years of education: Not on file  . Highest education level: Not on file  Occupational History  . Not on file  Tobacco Use  . Smoking status: Current Some Day Smoker    Packs/day: 0.50    Years: 20.00    Pack years: 10.00    Types: Cigars    Last attempt to quit: 12/02/2009    Years since quitting: 10.4  . Smokeless tobacco: Former Systems developer    Types: Chew  . Tobacco comment: cigars currently 2021  Substance and Sexual Activity  . Alcohol  use: Yes    Alcohol/week: 10.0 standard drinks    Types: 10 Shots of liquor per week  . Drug use: Not Currently    Types: Marijuana    Comment: marijuana "last time ~ 1987"  . Sexual activity: Yes  Other Topics Concern  . Not on file  Social History Narrative   Married, 1 child, works in Actor for hospitals.    04/2019   Social Determinants of Health   Financial Resource Strain: Not on file  Food Insecurity: Not on file  Transportation Needs: Not on file  Physical Activity: Not on file  Stress: Not on file  Social Connections: Not on file  Intimate Partner Violence: Not on file    Family History  Problem Relation Age of Onset  . Cancer Mother        breast  . Hypertension Sister   . Heart disease Brother   . Arrhythmia Brother   . Hypertension Father   . Heart disease Father 8       died of MI  . Diabetes type II Paternal Grandmother      Current Outpatient Medications:  .  aspirin EC 81 MG tablet, Take 1  tablet (81 mg total) by mouth daily., Disp: 90 tablet, Rfl: 3 .  insulin aspart (NOVOLOG FLEXPEN) 100 UNIT/ML FlexPen, Inject 0-15 Units into the skin 3 (three) times daily with meals., Disp: 15 mL, Rfl: 3 .  insulin detemir (LEVEMIR) 100 UNIT/ML FlexPen, Inject 17 Units into the skin daily at 10 pm., Disp: 5 pen, Rfl: 11 .  Insulin Pen Needle (BD PEN NEEDLE NANO U/F) 32G X 4 MM MISC, 1 each by Does not apply route 3 (three) times daily., Disp: 100 each, Rfl: 11 .  rosuvastatin (CRESTOR) 10 MG tablet, Take 1 tablet (10 mg total) by mouth at bedtime., Disp: 90 tablet, Rfl: 3  Allergies  Allergen Reactions  . Lisinopril Swelling    Lip swelling  . Penicillins Hives       Review of Systems Constitutional: -fever, -chills, -sweats, -unexpected weight change, -decreased appetite, -fatigue Allergy: -sneezing, -itching, -congestion Dermatology: -changing moles, --rash, -lumps ENT: -runny nose, -ear pain, -sore throat, -hoarseness, -sinus pain, -teeth pain,  - ringing in ears, -hearing loss, -nosebleeds Cardiology: -chest pain, -palpitations, -swelling, -difficulty breathing when lying flat, -waking up short of breath Respiratory: -cough, -shortness of breath, -difficulty breathing with exercise or exertion, -wheezing, -coughing up blood Gastroenterology: -abdominal pain, -nausea, -vomiting, -diarrhea, -constipation, -blood in stool, -changes in bowel movement, -difficulty swallowing or eating Hematology: -bleeding, -bruising  Musculoskeletal: -joint aches, -muscle aches, -joint swelling, -back pain, -neck pain, -cramping, -changes in gait Ophthalmology: denies vision changes, eye redness, itching, discharge Urology: -burning with urination, -difficulty urinating, -blood in urine, -urinary frequency, -urgency, -incontinence Neurology: -headache, -weakness, -tingling, -numbness, -memory loss, -falls, -dizziness Psychology: -depressed mood, -agitation, -sleep problems Male GU: no testicular mass, pain, no lymph nodes swollen, no swelling, rash     Objective:  BP 120/82   Pulse 84   Ht 5\' 9"  (1.753 m)   Wt 142 lb (64.4 kg)   SpO2 96%   BMI 20.97 kg/m   General appearance: alert, no distress, WD/WN, African American male Skin: no other rash, some nail pitting in fingernails Neck: supple, no lymphadenopathy, no thyromegaly, no masses, normal ROM, no bruits Chest: non tender, normal shape and expansion Heart: RRR, normal S1, S2, no murmurs Lungs: CTA bilaterally, no wheezes, rhonchi, or rales Abdomen: +bs, soft, non tender, non distended, no masses, no hepatomegaly, no splenomegaly, no bruits Back: non tender, normal ROM, no scoliosis Musculoskeletal: upper extremities non tender, no obvious deformity, normal ROM throughout, lower extremities non tender, no obvious deformity, normal ROM throughout Extremities: no edema, no cyanosis, no clubbing Pulses: 2+ symmetric, upper and lower extremities, normal cap refill Neurological: alert, oriented x  3, CN2-12 intact, strength normal upper extremities and lower extremities, sensation normal throughout, DTRs 2+ throughout, no cerebellar signs, gait normal Psychiatric: normal affect, behavior normal, pleasant  GU: normal male external genitalia,circumcised, nontender, no masses, no hernia, no lymphadenopathy Rectal: deferred/declined  Diabetic Foot Exam - Simple   Simple Foot Form Diabetic Foot exam was performed with the following findings: Yes 05/05/2020  3:26 PM  Visual Inspection See comments: Yes Sensation Testing Intact to touch and monofilament testing bilaterally: Yes Pulse Check Posterior Tibialis and Dorsalis pulse intact bilaterally: Yes Comments Somewhat thickened toenails throughout       Assessment and Plan :   Encounter Diagnoses  Name Primary?  . Routine general medical examination at a health care facility Yes  . Colon cancer screening   . Hepatitis B immune   . Vaccine counseling   . Family history of premature  CAD   . Benign prostatic hyperplasia with lower urinary tract symptoms, symptom details unspecified   . Screen for colon cancer   . Screening for prostate cancer   . Type 2 diabetes mellitus with hyperglycemia, unspecified whether long term insulin use ([place])   . Hyperlipidemia, unspecified hyperlipidemia type   . Need for Td vaccine   . Screening for heart disease   . Aortic atherosclerosis ([place])   . Work stress     Physical exam - discussed and counseled on healthy lifestyle, diet, exercise, preventative care, vaccinations, sick and well care, proper use of emergency dept and after hours care, and addressed their concerns.    Health screening: See your eye doctor yearly for routine vision care. See your dentist yearly for routine dental care including hygiene visits twice yearly.  Cancer screening Colonoscopy:  Referred for screening colonoscopy  Discussed PSA, prostate exam, and prostate cancer screening risks/benefits.     Discussed  skin surveillance    Vaccinations: Advised yearly influenza vaccine, pneumococcal 23, Shingrix, and Td.  He will consider.  He is up to date on Covid vaccine.  Counseled on the Td (tetanus, diptheria) vaccine.  Vaccine information sheet given. Td vaccine given after consent obtained.    Acute issues discussed: None    Separate significant chronic issues discussed: Diabetes - advised more frequent f/u, screening.   Counseled on diet, exercise, glucometer testing, daily foot exam, yearly eye doctor f/u  BPH - PSA level   Hyperlipidemia - noncompliance with statin.  Discussed need to be on statin for prevention  Aortic atherosclerosis on 2018 CT abdomen.  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  Diagnoses and all orders for this visit:  Routine general medical examination at a health care facility -     Comprehensive metabolic panel -     CBC with Differential/Platelet -     Lipid panel -     Hemoglobin A1c -     Microalbumin/Creatinine Ratio, Urine -     PSA  Colon cancer screening -     Ambulatory referral to Gastroenterology  Hepatitis B immune  Vaccine counseling  Family history of premature CAD -     Ambulatory referral to Cardiology  Benign prostatic hyperplasia with lower urinary tract symptoms, symptom details unspecified  Screen for colon cancer  Screening for prostate cancer -     PSA  Type 2 diabetes mellitus with hyperglycemia, unspecified whether long term insulin use ([facility]) -     Comprehensive metabolic panel -     Hemoglobin A1c -     Ambulatory referral to Cardiology  Hyperlipidemia, unspecified hyperlipidemia type -     Comprehensive metabolic panel -     Lipid panel -     Ambulatory referral to Cardiology  Need for Td vaccine  Screening for heart disease -     Ambulatory referral to Cardiology  Aortic  atherosclerosis [facility])  Work stress    Follow-up pending labs, 56mo diabetes f/u

## 2020-05-05 NOTE — Addendum Note (Signed)
Addended by: Edgar Frisk on: 05/05/2020 04:46 PM   Modules accepted: Orders

## 2020-05-06 ENCOUNTER — Other Ambulatory Visit: Payer: Self-pay | Admitting: Medical

## 2020-05-06 LAB — LIPID PANEL
Chol/HDL Ratio: 2.3 ratio (ref 0.0–5.0)
Cholesterol, Total: 217 mg/dL — ABNORMAL HIGH (ref 100–199)
HDL: 94 mg/dL (ref >39)
LDL Chol Calc (NIH): 113 mg/dL — ABNORMAL HIGH (ref 0–99)
Triglycerides: 55 mg/dL (ref 0–149)
VLDL Cholesterol Cal: 10 mg/dL (ref 5–40)

## 2020-05-06 LAB — COMPREHENSIVE METABOLIC PANEL
ALT: 16 IU/L (ref 0–44)
AST: 19 IU/L (ref 0–40)
Albumin/Globulin Ratio: 2.2 (ref 1.2–2.2)
Albumin: 4.8 g/dL (ref 3.8–4.9)
Alkaline Phosphatase: 70 IU/L (ref 44–121)
BUN/Creatinine Ratio: 11 (ref 9–20)
BUN: 11 mg/dL (ref 6–24)
Bilirubin Total: 0.3 mg/dL (ref 0.0–1.2)
CO2: 25 mmol/L (ref 20–29)
Calcium: 9.8 mg/dL (ref 8.7–10.2)
Chloride: 99 mmol/L (ref 96–106)
Creatinine, Ser: 0.97 mg/dL (ref 0.76–1.27)
Globulin, Total: 2.2 g/dL (ref 1.5–4.5)
Glucose: 79 mg/dL (ref 65–99)
Potassium: 4.8 mmol/L (ref 3.5–5.2)
Sodium: 137 mmol/L (ref 134–144)
Total Protein: 7 g/dL (ref 6.0–8.5)
eGFR: 93 mL/min/{1.73_m2} (ref >59)

## 2020-05-06 LAB — CBC WITH DIFFERENTIAL/PLATELET
Basophils Absolute: 0 10*3/uL (ref 0.0–0.2)
Basos: 1 %
EOS (ABSOLUTE): 0.1 10*3/uL (ref 0.0–0.4)
Eos: 1 %
Hematocrit: 41.8 % (ref 37.5–51.0)
Hemoglobin: 14 g/dL (ref 13.0–17.7)
Immature Grans (Abs): 0 10*3/uL (ref 0.0–0.1)
Immature Granulocytes: 0 %
Lymphocytes Absolute: 2 10*3/uL (ref 0.7–3.1)
Lymphs: 43 %
MCH: 30.4 pg (ref 26.6–33.0)
MCHC: 33.5 g/dL (ref 31.5–35.7)
MCV: 91 fL (ref 79–97)
Monocytes Absolute: 0.7 10*3/uL (ref 0.1–0.9)
Monocytes: 15 %
Neutrophils Absolute: 1.9 10*3/uL (ref 1.4–7.0)
Neutrophils: 40 %
Platelets: 262 10*3/uL (ref 150–450)
RBC: 4.6 x10E6/uL (ref 4.14–5.80)
RDW: 12.8 % (ref 11.6–15.4)
WBC: 4.6 10*3/uL (ref 3.4–10.8)

## 2020-05-06 LAB — PSA: Prostate Specific Ag, Serum: 0.7 ng/mL (ref 0.0–4.0)

## 2020-05-06 LAB — HEMOGLOBIN A1C
Est. average glucose Bld gHb Est-mCnc: 143 mg/dL
Hgb A1c MFr Bld: 6.6 % — ABNORMAL HIGH (ref 4.8–5.6)

## 2020-05-06 LAB — MICROALBUMIN / CREATININE URINE RATIO
Creatinine, Urine: 108.1 mg/dL
Microalb/Creat Ratio: 8 mg/g creat (ref 0–29)
Microalbumin, Urine: 8.6 ug/mL

## 2020-05-06 MED ORDER — ROSUVASTATIN CALCIUM 10 MG PO TABS: 10.0000 mg | ORAL_TABLET | Freq: Every day | ORAL | 3 refills | Status: DC

## 2020-05-06 MED ORDER — ASPIRIN EC 81 MG PO TBEC: 81.0000 mg | DELAYED_RELEASE_TABLET | Freq: Every day | ORAL | 3 refills | Status: DC

## 2020-05-06 MED ORDER — BD PEN NEEDLE NANO U/F 32G X 4 MM MISC: 1.0000 | Freq: Three times a day (TID) | 11 refills | Status: DC

## 2020-05-06 MED ORDER — NOVOLOG FLEXPEN 100 UNIT/ML ~~LOC~~ SOPN: 0.0000 [IU] | PEN_INJECTOR | Freq: Three times a day (TID) | SUBCUTANEOUS | 1 refills | Status: DC

## 2020-05-06 MED ORDER — INSULIN DETEMIR 100 UNIT/ML FLEXPEN: 17.0000 [IU] | PEN_INJECTOR | Freq: Every day | SUBCUTANEOUS | 5 refills | Status: DC

## 2020-07-04 ENCOUNTER — Ambulatory Visit: Payer: BC Managed Care – PPO | Admitting: Cardiovascular Disease

## 2020-07-04 ENCOUNTER — Encounter: Payer: Self-pay | Admitting: Cardiovascular Disease

## 2020-07-04 ENCOUNTER — Other Ambulatory Visit: Payer: Self-pay

## 2020-07-04 DIAGNOSIS — I517 Cardiomegaly: Secondary | ICD-10-CM | POA: Diagnosis not present

## 2020-07-04 DIAGNOSIS — E782 Mixed hyperlipidemia: Secondary | ICD-10-CM | POA: Diagnosis not present

## 2020-07-04 DIAGNOSIS — Z8249 Family history of ischemic heart disease and other diseases of the circulatory system: Secondary | ICD-10-CM

## 2020-07-04 MED ORDER — ROSUVASTATIN CALCIUM 20 MG PO TABS: 20.0000 mg | ORAL_TABLET | Freq: Every day | ORAL | 3 refills | Status: DC

## 2020-07-04 NOTE — Assessment & Plan Note (Signed)
Father died of a myocardial infarction at age 54.

## 2020-07-04 NOTE — Patient Instructions (Signed)
Medication Instructions:   INCREASE ROSUVASTATIN TO 20 MG ONCE DAILY= 2 OF THE 10 MG TABLETS ONCE DAILY  *If you need a refill on your cardiac medications before your next appointment, please call your pharmacy*   Lab Work:  Your physician recommends that you return for lab work in: [place]  If you have labs (blood work) drawn today and your tests are completely normal, you will receive your results only by: [place] (if you have MyChart) OR A paper copy in the mail If you have any lab test that is abnormal or we need to change your treatment, we will call you to review the results.   Testing/Procedures:  Your physician has requested that you have an echocardiogram. Echocardiography is a painless test that uses sound waves to create images of your heart. It provides your doctor with information about the size and shape of your heart and how well your heart's chambers and valves are working. This procedure takes approximately one hour. There are no restrictions for this procedure. [place]   Follow-Up: At [facility], you and your health needs are our priority.  As part of our continuing mission to provide you with exceptional heart care, we have created designated Provider Care Teams.  These Care Teams include your primary Cardiologist (physician) and Advanced Practice Providers (APPs -  Physician Assistants and Nurse Practitioners) who all work together to provide you with the care you need, when you need it.  We recommend signing up for the patient portal called "MyChart".  Sign up information is provided on this After Visit Summary.  MyChart is used to connect with patients for Virtual Visits (Telemedicine).  Patients are able to view lab/test results, encounter notes, upcoming appointments, etc.  Non-urgent messages can be sent to your provider as well.   To learn more about what you can do  with MyChart, go to [email or web address].    Your next appointment:   12 month(s)  The format for your next appointment:   In Person  Provider:   Quay Burow, MD

## 2020-07-04 NOTE — Assessment & Plan Note (Signed)
History of hyperlipidemia on statin therapy with lipid profile performed 05/05/2020 revealing a total cholesterol 217, LDL 113 and HDL of 94.  I am going to increase his rosuvastatin from 10 to 20 mg a day.

## 2020-07-04 NOTE — Progress Notes (Signed)
07/04/2020 Richard Berg   04-20-1965  [id number]  Primary Physician Tysinger, Camelia Eng, PA-C Primary Cardiologist: Lorretta Harp MD Lupe Carney, [place]  HPI:  Richard Berg is a 55 y.o. fit and fit appearing married African-American male father of 1 daughter referred by Chana Bode, PA-C to be evaluated because of family history and risk factors.  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  He is originally from [place] and has been in the country for 35 years.  His risk factors are smoking several cigars a week as well as treated diabetes and hyperlipidemia.  His father did die of a myocardial infarction at age 29.  He is never had a heart attack or stroke.  He denies chest pain or shortness of breath.   Current Meds  Medication Sig   aspirin EC 81 MG tablet Take 1 tablet (81 mg total) by mouth daily.   insulin aspart (NOVOLOG FLEXPEN) 100 UNIT/ML FlexPen Inject 0-15 Units into the skin 3 (three) times daily with meals.   insulin detemir (LEVEMIR) 100 UNIT/ML FlexPen Inject 17 Units into the skin daily at 10 pm.   Insulin Pen Needle (BD PEN NEEDLE NANO U/F) 32G X 4 MM MISC 1 each by Does not apply route 3 (three) times daily.   rosuvastatin (CRESTOR) 10 MG tablet Take 1 tablet (10 mg total) by mouth at bedtime.     Allergies  Allergen Reactions   Lisinopril Swelling    Lip swelling   Penicillins Hives    Social History   Socioeconomic History   Marital status: Married    Spouse name: Not on file   Number of children: Not on file   Years of education: Not on file   Highest education level: Not on file  Occupational History   Not on file  Tobacco Use   Smoking status: Some Days    Packs/day: 0.50    Years: 20.00    Pack years: 10.00    Types: Cigars, Cigarettes    Last attempt to quit: 12/02/2009    Years since quitting: 10.5   Smokeless tobacco: Former    Types: Chew   Tobacco comments:    cigars currently 2021  Substance and  Sexual Activity   Alcohol use: Yes    Alcohol/week: 10.0 standard drinks    Types: 10 Shots of liquor per week   Drug use: Not Currently    Types: Marijuana    Comment: marijuana "last time ~ 1987"   Sexual activity: Yes  Other Topics Concern   Not on file  Social History Narrative   Married, 1 child, works in Actor for hospitals.    04/2019   Social Determinants of Health   Financial Resource Strain: Not on file  Food Insecurity: Not on file  Transportation Needs: Not on file  Physical Activity: Not on file  Stress: Not on file  Social Connections: Not on file  Intimate Partner Violence: Not on file     Review of Systems: General: negative for chills, fever, night sweats or weight changes.  Cardiovascular: negative for chest pain, dyspnea on exertion, edema, orthopnea, palpitations, paroxysmal nocturnal dyspnea or shortness of breath Dermatological: negative for rash Respiratory: negative for cough or wheezing Urologic: negative for hematuria Abdominal: negative for nausea, vomiting, diarrhea, bright red blood per rectum, melena, or hematemesis Neurologic: negative for visual changes, syncope, or dizziness All other systems reviewed and are otherwise negative except as noted  above.    Blood pressure 115/73, pulse 66, height 5\' 9"  (1.753 m), weight 140 lb 12.8 oz (63.9 kg), SpO2 95 %.  General appearance: alert and no distress Neck: no adenopathy, no carotid bruit, no JVD, supple, symmetrical, trachea midline, and thyroid not enlarged, symmetric, no tenderness/mass/nodules Lungs: clear to auscultation bilaterally Heart: regular rate and rhythm, S1, S2 normal, no murmur, click, rub or gallop Extremities: extremities normal, atraumatic, no cyanosis or edema Pulses: 2+ and symmetric Skin: Skin color, texture, turgor normal. No rashes or lesions Neurologic: Grossly normal  EKG sinus rhythm at 66 with evidence of LVH by voltage criteria.  I personally reviewed this  EKG.  ASSESSMENT AND PLAN:   Hyperlipidemia History of hyperlipidemia on statin therapy with lipid profile performed 05/05/2020 revealing a total cholesterol 217, LDL 113 and HDL of 94.  I am going to increase his rosuvastatin from 10 to 20 mg a day.  Family history of heart disease Father died of a myocardial infarction at age 80.  Left ventricular hypertrophy Twelve-lead EKG shows voltage for LVH.  I am going to check a 2D echo to further evaluate.     Lorretta Harp MD FACP,FACC,FAHA, FSCAI 07/04/2020 1:32 PM

## 2020-07-04 NOTE — Assessment & Plan Note (Signed)
Twelve-lead EKG shows voltage for LVH.  I am going to check a 2D echo to further evaluate.

## 2020-08-01 ENCOUNTER — Ambulatory Visit (INDEPENDENT_AMBULATORY_CARE_PROVIDER_SITE_OTHER)
Admission: RE | Admit: 2020-08-01 | Discharge: 2020-08-01 | Disposition: A | Discharge status: Home / Self Care | Payer: Self-pay | Source: Ambulatory Visit | Attending: Cardiovascular Disease | Admitting: Cardiovascular Disease

## 2020-08-01 ENCOUNTER — Other Ambulatory Visit: Payer: Self-pay

## 2020-08-01 ENCOUNTER — Ambulatory Visit (HOSPITAL_COMMUNITY): Payer: BC Managed Care – PPO | Attending: Cardiovascular Disease

## 2020-08-01 DIAGNOSIS — I517 Cardiomegaly: Secondary | ICD-10-CM

## 2020-08-01 DIAGNOSIS — Z8249 Family history of ischemic heart disease and other diseases of the circulatory system: Secondary | ICD-10-CM

## 2020-08-01 LAB — ECHOCARDIOGRAM COMPLETE
Area-P 1/2: 3.76 cm2
S' Lateral: 2.9 cm

## 2020-09-18 ENCOUNTER — Telehealth: Payer: Self-pay | Admitting: Medical

## 2020-09-18 ENCOUNTER — Encounter: Payer: Self-pay | Admitting: Internal Medicine

## 2020-09-18 NOTE — Telephone Encounter (Signed)
noted 

## 2020-09-18 NOTE — Telephone Encounter (Signed)
Received a call from [facility], Dr. Lorie Apley office. They were calling to advise that pt is no longer seen at their facility. Pt transferred out to [facility] and according to their records is due for a Colonoscopy.

## 2020-11-06 ENCOUNTER — Ambulatory Visit: Payer: BC Managed Care – PPO | Admitting: Medical

## 2020-11-06 ENCOUNTER — Encounter: Payer: Self-pay | Admitting: Medical

## 2020-11-06 ENCOUNTER — Other Ambulatory Visit: Payer: Self-pay

## 2020-11-06 VITALS — BP 138/80 | HR 86 | Ht 69.5 in | Wt 139.6 lb

## 2020-11-06 DIAGNOSIS — E782 Mixed hyperlipidemia: Secondary | ICD-10-CM

## 2020-11-06 DIAGNOSIS — E119 Type 2 diabetes mellitus without complications: Secondary | ICD-10-CM | POA: Diagnosis not present

## 2020-11-06 DIAGNOSIS — Z7185 Encounter for immunization safety counseling: Secondary | ICD-10-CM | POA: Diagnosis not present

## 2020-11-06 DIAGNOSIS — L989 Disorder of the skin and subcutaneous tissue, unspecified: Secondary | ICD-10-CM | POA: Insufficient documentation

## 2020-11-06 DIAGNOSIS — Z794 Long term (current) use of insulin: Secondary | ICD-10-CM | POA: Diagnosis not present

## 2020-11-06 DIAGNOSIS — Z8249 Family history of ischemic heart disease and other diseases of the circulatory system: Secondary | ICD-10-CM

## 2020-11-06 DIAGNOSIS — Z1211 Encounter for screening for malignant neoplasm of colon: Secondary | ICD-10-CM

## 2020-11-06 DIAGNOSIS — I700 Atherosclerosis of aorta: Secondary | ICD-10-CM

## 2020-11-06 DIAGNOSIS — Z2820 Immunization not carried out because of patient decision for unspecified reason: Secondary | ICD-10-CM | POA: Insufficient documentation

## 2020-11-06 LAB — POCT GLYCOSYLATED HEMOGLOBIN (HGB A1C): Hemoglobin A1C: 6.3 % — AB (ref 4.0–5.6)

## 2020-11-06 MED ORDER — CLOTRIMAZOLE-BETAMETHASONE 1-0.05 % EX CREA: 1.0000 "application " | TOPICAL_CREAM | Freq: Every day | CUTANEOUS | 0 refills | Status: DC

## 2020-11-06 NOTE — Progress Notes (Addendum)
Subjective: Chief Complaint  Patient presents with   med check    Fasting med check, declines flu shot, discuss something on his foot    Here for routine med check.  Here for a skin lesion on the back of the scalp he is concerned about.  It has been there several months and getting bigger  He has several tender small bumps on his left foot that he wants checked out.  No other concerns  Hyperlipidemia-compliant with medication some days, sometimes he forgets.  Since last visit he did see the cardiologist for a referral, had echocardiogram and CT coronaries test  Diabetes-blood sugars have been okay.  He is compliant with Levemir 17 units per night and on average only uses about 1 unit of NovoLog for meals.  No other worsening symptoms.  Past Medical History:  Diagnosis Date   Constipation    Diabetes mellitus    dx'd 02/15/11   Family history of premature CAD    father MI in early 54s   Hepatitis B immune 03/2014   per titer   Hyperlipidemia    Positive TB test    "took the treatment ~ 1993"   Current Outpatient Medications on File Prior to Visit  Medication Sig Dispense Refill   aspirin EC 81 MG tablet Take 1 tablet (81 mg total) by mouth daily. 90 tablet 3   insulin aspart (NOVOLOG FLEXPEN) 100 UNIT/ML FlexPen Inject 0-15 Units into the skin 3 (three) times daily with meals. 15 mL 1   insulin detemir (LEVEMIR) 100 UNIT/ML FlexPen Inject 17 Units into the skin daily at 10 pm. 3 mL 5   Insulin Pen Needle (BD PEN NEEDLE NANO U/F) 32G X 4 MM MISC 1 each by Does not apply route 3 (three) times daily. 100 each 11   rosuvastatin (CRESTOR) 20 MG tablet Take 1 tablet (20 mg total) by mouth at bedtime. 90 tablet 3   No current facility-administered medications on file prior to visit.   ROS as in subjective    Objective: BP 138/80   Pulse 86   Ht 5' 9.5" (1.765 m)   Wt 139 lb 9.6 oz (63.3 kg)   BMI 20.32 kg/m   General appearence: alert, no distress, WD/WN,  Heart: RRR,  normal S1, S2, no murmurs Lungs: CTA bilaterally, no wheezes, rhonchi, or rales Pulses: 2+ symmetric, upper and lower extremities, normal cap refill Ext: no edema  Diabetic Foot Exam - Simple   Simple Foot Form Diabetic Foot exam was performed with the following findings: Yes 11/06/2020  8:56 AM  Visual Inspection See comments: Yes Sensation Testing Intact to touch and monofilament testing bilaterally: Yes Pulse Check Posterior Tibialis and Dorsalis pulse intact bilaterally: Yes Comments Left lateral foot mid to distal foot along 5th metatarsal region with 4 separate plantar warts, tender     I reviewed echocardiogram from July 2022, CT coronary calcium from 07/2020 with score of zero.   Assessment: Encounter Diagnoses  Name Primary?   Insulin dependent type 2 diabetes mellitus ([place]) Yes   Screen for colon cancer    Skin lesion    Vaccine counseling    Mixed hyperlipidemia    Family history of heart disease    Colon cancer screening    Aortic atherosclerosis ([place])    Vaccine refused by patient      Plan:  Diabetes Your HgbA1C was 6.3% today, good, at goal Continue current medications Continue to monitor closely for any kind of sores or wounds or  nonhealing wounds See your eye doctor yearly for routine diabetic eye care Consider seeing podiatry for diabetic nail care obesity Check your blood sugars regularly.  Goal less than 130 fasting  High cholesterol, aortic atherosclerosis of the abdominal aorta mild Thankfully your recent CT coronary score showed no cholesterol buildup in the coronaries You do have mild buildup in the abdominal aorta For given diabetes, family history of heart disease and the atherosclerosis finding that you should continue on Crestor preferably every day  Skin lesion on back of scalp You have 2 lesions on the back of your scalp.  You do Lotrisone cream daily for 2 weeks.  Do not use this more than 2 weeks. I have referred you to dermatology.   If the lesions do not resolve completely after 2 weeks of the cream and you should see the dermatologist.  Plantar wart of feet Begin over-the-counter Compound W or other wart remover daily.  It may take 2 to 3 weeks to see much improvement Have your wife paint on the liquid at nighttime at bedtime If not improving over the next few weeks recheck or have a dermatologist look at this when you see them  We made referrals again today for gastroenterologist you can update your colon cancer screening with Dr. Benson Norway  Vaccines: I recommend yearly flu shot I recommend pneumococcal vaccine, pneumococcal 23 I recommended shingles vaccine    Keijuan was seen today for med check.  Diagnoses and all orders for this visit:  Insulin dependent type 2 diabetes mellitus ([place]) -     HgB A1c  Screen for colon cancer -     Ambulatory referral to Gastroenterology  Skin lesion -     Ambulatory referral to Dermatology  Vaccine counseling  Mixed hyperlipidemia  Family history of heart disease  Colon cancer screening  Aortic atherosclerosis ([place])  Vaccine refused by patient  Other orders -     clotrimazole-betamethasone (LOTRISONE) cream; Apply 1 application topically daily.  F/u 05/2021 for physical

## 2020-11-06 NOTE — Patient Instructions (Signed)
Diabetes Your HgbA1C was 6.3% today, good, at goal Continue current medications Continue to monitor closely for any kind of sores or wounds or nonhealing wounds See your eye doctor yearly for routine diabetic eye care Consider seeing podiatry for diabetic nail care obesity Check your blood sugars regularly.  Goal less than 130 fasting  High cholesterol, aortic atherosclerosis of the abdominal aorta mild Thankfully your recent CT coronary score showed no cholesterol buildup in the coronaries You do have mild buildup in the abdominal aorta For given diabetes, family history of heart disease and the atherosclerosis finding that you should continue on Crestor preferably every day  Skin lesion on back of scalp You have 2 lesions on the back of your scalp.  You do Lotrisone cream daily for 2 weeks.  Do not use this more than 2 weeks. I have referred you to dermatology.  If the lesions do not resolve completely after 2 weeks of the cream and you should see the dermatologist.  Plantar wart of feet Begin over-the-counter Compound W or other wart remover daily.  It may take 2 to 3 weeks to see much improvement Have your wife paint on the liquid at nighttime at bedtime If not improving over the next few weeks recheck or have a dermatologist look at this when you see them  We made referrals again today for gastroenterologist you can update your colon cancer screening with Dr. Benson Norway  Vaccines: I recommend yearly flu shot I recommend pneumococcal vaccine, pneumococcal 23 I recommended shingles vaccine

## 2020-11-07 NOTE — Addendum Note (Signed)
Addended by: Carlena Hurl on: 11/07/2020 10:31 AM   Modules accepted: Orders

## 2020-12-22 DIAGNOSIS — L84 Corns and callosities: Secondary | ICD-10-CM | POA: Diagnosis not present

## 2020-12-22 DIAGNOSIS — L281 Prurigo nodularis: Secondary | ICD-10-CM | POA: Diagnosis not present

## 2020-12-22 DIAGNOSIS — L298 Other pruritus: Secondary | ICD-10-CM | POA: Diagnosis not present

## 2020-12-23 DIAGNOSIS — Z20822 Contact with and (suspected) exposure to covid-19: Secondary | ICD-10-CM | POA: Diagnosis not present

## 2021-05-06 ENCOUNTER — Encounter: Payer: Self-pay | Admitting: Medical

## 2021-05-06 ENCOUNTER — Ambulatory Visit (INDEPENDENT_AMBULATORY_CARE_PROVIDER_SITE_OTHER): Payer: BC Managed Care – PPO | Admitting: Medical

## 2021-05-06 VITALS — BP 132/90 | HR 76 | Temp 98.5°F | Ht 68.5 in | Wt 143.2 lb

## 2021-05-06 DIAGNOSIS — Z1211 Encounter for screening for malignant neoplasm of colon: Secondary | ICD-10-CM

## 2021-05-06 DIAGNOSIS — Z794 Long term (current) use of insulin: Secondary | ICD-10-CM | POA: Diagnosis not present

## 2021-05-06 DIAGNOSIS — E119 Type 2 diabetes mellitus without complications: Secondary | ICD-10-CM | POA: Diagnosis not present

## 2021-05-06 DIAGNOSIS — I700 Atherosclerosis of aorta: Secondary | ICD-10-CM

## 2021-05-06 DIAGNOSIS — Z125 Encounter for screening for malignant neoplasm of prostate: Secondary | ICD-10-CM

## 2021-05-06 DIAGNOSIS — E782 Mixed hyperlipidemia: Secondary | ICD-10-CM

## 2021-05-06 DIAGNOSIS — H18413 Arcus senilis, bilateral: Secondary | ICD-10-CM

## 2021-05-06 DIAGNOSIS — Z0000 Encounter for general adult medical examination without abnormal findings: Secondary | ICD-10-CM | POA: Diagnosis not present

## 2021-05-06 DIAGNOSIS — M25531 Pain in right wrist: Secondary | ICD-10-CM | POA: Insufficient documentation

## 2021-05-06 DIAGNOSIS — Z2820 Immunization not carried out because of patient decision for unspecified reason: Secondary | ICD-10-CM

## 2021-05-06 DIAGNOSIS — Z8249 Family history of ischemic heart disease and other diseases of the circulatory system: Secondary | ICD-10-CM

## 2021-05-06 DIAGNOSIS — Z7185 Encounter for immunization safety counseling: Secondary | ICD-10-CM

## 2021-05-06 DIAGNOSIS — N401 Enlarged prostate with lower urinary tract symptoms: Secondary | ICD-10-CM

## 2021-05-06 DIAGNOSIS — Z1329 Encounter for screening for other suspected endocrine disorder: Secondary | ICD-10-CM

## 2021-05-06 DIAGNOSIS — K644 Residual hemorrhoidal skin tags: Secondary | ICD-10-CM | POA: Insufficient documentation

## 2021-05-06 NOTE — Patient Instructions (Signed)
?This visit was a preventative care visit, also known as wellness visit or routine physical.   Topics typically include healthy lifestyle, diet, exercise, preventative care, vaccinations, sick and well care, proper use of emergency dept and after hours care, as well as other concerns.   ? ? ?Recommendations: ?Continue to return yearly for your annual wellness and preventative care visits.  This gives [place] a chance to discuss healthy lifestyle, exercise, vaccinations, review your chart record, and perform screenings where appropriate. ? ?I recommend you see your eye doctor yearly for routine vision care. ? ?I recommend you see your dentist yearly for routine dental care including hygiene visits twice yearly. ? ? ?Vaccination recommendations were reviewed ?Immunization History  ?Administered Date(s) Administered  ? PFIZER(Purple Top)SARS-COV-2 Vaccination 03/09/2019, 04/10/2019, 12/18/2019  ? Td 05/05/2020  ? Tdap 08/15/2007  ? ?You declined vaccines today ? ?You are due for Shingles and Pneumcoccal vaccines ? ? ?Screening for cancer: ?Colon cancer screening: ?We will refer you for Cologard  ? ?We discussed PSA, prostate exam, and prostate cancer screening risks/benefits.    ? ?Skin cancer screening: ?Check your skin regularly for new changes, growing lesions, or other lesions of concern ?Come in for evaluation if you have skin lesions of concern. ? ?Lung cancer screening: ?If you have a greater than 20 pack year history of tobacco use, then you may qualify for lung cancer screening with a chest CT scan.   Please call your insurance company to inquire about coverage for this test. ? ?We currently don't have screenings for other cancers besides breast, cervical, colon, and lung cancers.  If you have a strong family history of cancer or have other cancer screening concerns, please let me know.  ? ? ?Bone health: ?Get at least 150 minutes of aerobic exercise weekly ?Get weight bearing exercise at least once weekly ?Bone  density test:  ?A bone density test is an imaging test that uses a type of X-ray to measure the amount of calcium and other minerals in your bones. ?The test may be used to diagnose or screen you for a condition that causes weak or thin bones (osteoporosis), predict your risk for a broken bone (fracture), or determine how well your osteoporosis treatment is working. ?The bone density test is recommended for females 54 and older, or females or males <51 if certain risk factors such as thyroid disease, long term use of steroids such as for asthma or rheumatological issues, vitamin D deficiency, estrogen deficiency, family history of osteoporosis, self or family history of fragility fracture in first degree relative. ? ? ? ?Heart health: ?Get at least 150 minutes of aerobic exercise weekly ?Limit alcohol ?It is important to maintain a healthy blood pressure and healthy cholesterol numbers ? ?Heart disease screening: ?Screening for heart disease includes screening for blood pressure, fasting lipids, glucose/diabetes screening, BMI height to weight ratio, reviewed of smoking status, physical activity, and diet.   ? ?Goals include blood pressure 120/80 or less, maintaining a healthy lipid/cholesterol profile, preventing diabetes or keeping diabetes numbers under good control, not smoking or using tobacco products, exercising most days per week or at least 150 minutes per week of exercise, and eating healthy variety of fruits and vegetables, healthy oils, and avoiding unhealthy food choices like fried food, fast food, high sugar and high cholesterol foods.   ? ?Other tests may possibly include EKG test, CT coronary calcium score, echocardiogram, exercise treadmill stress test.  ? ?Coronary calcium score of 0 July 2022.   ? ?  Echocardiogram July 2022 with 60 to 65% ejection fraction, no regional wall motion abnormalities, the only abnormal finding was mild dilation of the left atrium and mild aortic valve  sclerosis. ? ? ? ?Medical care options: ?I recommend you continue to seek care here first for routine care.  We try really hard to have available appointments Monday through Friday daytime hours for sick visits, acute visits, and physicals.  Urgent care should be used for after hours and weekends for significant issues that cannot wait till the next day.  The emergency department should be used for significant potentially life-threatening emergencies.  The emergency department is expensive, can often have long wait times for less significant concerns, so try to utilize primary care, urgent care, or telemedicine when possible to avoid unnecessary trips to the emergency department.  Virtual visits and telemedicine have been introduced since the pandemic started in 2020, and can be convenient ways to receive medical care.  We offer virtual appointments as well to assist you in a variety of options to seek medical care. ? ? ?Advanced Directives: ?I recommend you consider completing a [place] and Living Will.   These documents respect your wishes and help alleviate burdens on your loved ones if you were to become terminally ill or be in a position to need those documents enforced.   ? ?You can complete Advanced Directives yourself, have them notarized, then have copies made for our office, for you and for anybody you feel should have them in safe keeping. ? ?Or, you can have an attorney prepare these documents.   If you haven't updated your Last Will and Testament in a while, it may be worthwhile having an attorney prepare these documents together and save on some costs.     ? ? ?Separate significant issues discussed: ? ?Problem List Items Addressed This Visit   ? ? Vaccine counseling  ? Benign prostatic hyperplasia with lower urinary tract symptoms  ? Routine general medical examination at a health care facility - Primary  ? Relevant Orders  ? Comprehensive metabolic panel  ? CBC  ? Hemoglobin A1c   ? Microalbumin/Creatinine Ratio, Urine  ? Lipid panel  ? Urinalysis  ? PSA  ? Hepatitis C antibody  ? TSH  ? Cologuard  ? Family history of heart disease  ? Screen for colon cancer  ? Screening for prostate cancer  ? Relevant Orders  ? PSA  ? Hyperlipidemia  ?  I recommend you continue rosuvastatin Crestor daily for lowering cholesterol and lowering risk for heart disease.  Please call if you get low on medication so you do not run out ? ?  ?  ? Relevant Orders  ? Lipid panel  ? Aortic atherosclerosis ([place])  ?  Continue statin ? ?  ?  ? Insulin dependent type 2 diabetes mellitus ([place])  ?  Continue Levemir and mealtime insulin.  We want to avoid numbers under 70 though.  Pending your labs I may have you back off on some of the units.  See your eye doctor regular and have them send [place] a copy of your eye exam.  Exercise most days per week.  Check your feet daily for sores or wounds. ? ?  ?  ? Relevant Orders  ? Hemoglobin A1c  ? Microalbumin/Creatinine Ratio, Urine  ? Vaccine refused by patient  ? External hemorrhoid  ? RESOLVED: Colon cancer screening  ? Relevant Orders  ? Cologuard  ? ?Other Visit Diagnoses   ? ?  Screening for thyroid disorder      ? Relevant Orders  ? TSH  ? ?  ? ?

## 2021-05-06 NOTE — Assessment & Plan Note (Signed)
Continue Levemir and mealtime insulin.  We want to avoid numbers under 70 though.  Pending your labs I may have you back off on some of the units.  See your eye doctor regular and have them send [place] a copy of your eye exam.  Exercise most days per week.  Check your feet daily for sores or wounds. ?

## 2021-05-06 NOTE — Assessment & Plan Note (Signed)
I recommend you continue rosuvastatin Crestor daily for lowering cholesterol and lowering risk for heart disease.  Please call if you get low on medication so you do not run out ?

## 2021-05-06 NOTE — Progress Notes (Addendum)
?Subjective:  ? ?HPI ? Richard Berg is a 56 y.o. male who presents for ?Chief Complaint  ?Patient presents with  ? Annual Exam  ?  CPE rt. Wrist pain started a couple of weeks ago, dull pain lt. Forearm that comes and goes. Does see eye doctor on Friday of this week. Dr. Nicki Reaper eye doctor.   ? ? ?Patient Care Team: ?Deoni Cosey, Leward Quan as PCP - General (Family Medicine) ?Sees dentist ?Sees eye doctor ? ?Concerns: ?Diabetes-compliant with Levemir 15 units nightly, NovoLog mealtime 3 to 4 units per meal, using sliding scale.  The highest sugar numbers she sees is 150s but he does get occasional 60.  No current symptoms of concern ? ?He ran out of Crestor few months ago and forgot to call us for refill. ? ?She notes some right wrist pain intermittent.  He does use his hand to unscrew gas canisters throughout the day.  No fall or injury ? ? ?Reviewed their medical, surgical, family, social, medication, and allergy history and updated chart as appropriate. ? ?Past Medical History:  ?Diagnosis Date  ? Constipation   ? Diabetes mellitus   ? dx'd 02/15/11  ? Family history of premature CAD   ? father MI in early 34s  ? Hepatitis B immune 03/2014  ? per titer  ? Hyperlipidemia   ? Positive TB test   ? "took the treatment ~ 1993"  ? ? ?Past Surgical History:  ?Procedure Laterality Date  ? COLONOSCOPY  2011  ? ? ?Family History  ?Problem Relation Age of Onset  ? Cancer Mother   ?     breast  ? Hypertension Sister   ? Heart disease Brother   ? Arrhythmia Brother   ? Hypertension Father   ? Heart disease Father 36  ?     died of MI  ? Diabetes type II Paternal Grandmother   ? ? ? ?Current Outpatient Medications:  ?  aspirin EC 81 MG tablet, Take 1 tablet (81 mg total) by mouth daily., Disp: 90 tablet, Rfl: 3 ?  insulin aspart (NOVOLOG FLEXPEN) 100 UNIT/ML FlexPen, Inject 0-15 Units into the skin 3 (three) times daily with meals., Disp: 15 mL, Rfl: 1 ?  insulin detemir (LEVEMIR) 100 UNIT/ML FlexPen, Inject 17 Units into  the skin daily at 10 pm., Disp: 3 mL, Rfl: 5 ?  Insulin Pen Needle (BD PEN NEEDLE NANO U/F) 32G X 4 MM MISC, 1 each by Does not apply route 3 (three) times daily., Disp: 100 each, Rfl: 11 ?  clotrimazole-betamethasone (LOTRISONE) cream, Apply 1 application topically daily. (Patient not taking: Reported on 05/06/2021), Disp: 30 g, Rfl: 0 ?  rosuvastatin (CRESTOR) 20 MG tablet, Take 1 tablet (20 mg total) by mouth at bedtime. (Patient not taking: Reported on 05/06/2021), Disp: 90 tablet, Rfl: 3 ? ?Allergies  ?Allergen Reactions  ? Lisinopril Swelling  ?  Lip swelling  ? Penicillins Hives  ? ? ? ?Review of Systems ?Constitutional: -fever, -chills, -sweats, -unexpected weight change, +decreased appetite, -fatigue ?Allergy: -sneezing, -itching, -congestion ?Dermatology: -changing moles, --rash, -lumps ?ENT: -runny nose, -ear pain, -sore throat, -hoarseness, -sinus pain, -teeth pain, - ringing in ears, -hearing loss, -nosebleeds ?Cardiology: -chest pain, -palpitations, -swelling, -difficulty breathing when lying flat, -waking up short of breath ?Respiratory: -cough, -shortness of breath, -difficulty breathing with exercise or exertion, -wheezing, -coughing up blood ?Gastroenterology: -abdominal pain, -nausea, -vomiting, -diarrhea, -constipation, -blood in stool, -changes in bowel movement, -difficulty swallowing or eating ?Hematology: -bleeding, -bruising  ?Musculoskeletal: -  joint aches, -muscle aches, -joint swelling, -back pain, -neck pain, -cramping, -changes in gait ?Ophthalmology: denies vision changes, eye redness, itching, discharge ?Urology: -burning with urination, -difficulty urinating, -blood in urine, -urinary frequency, -urgency, -incontinence ?Neurology: -headache, -weakness, -tingling, -numbness, -memory loss, -falls, -dizziness ?Psychology: -depressed mood, -agitation, -sleep problems ?Male GU: no testicular mass, pain, no lymph nodes swollen, no swelling, no rash. ? ? ?  05/06/2021  ?  9:47 AM 11/06/2020  ?   8:19 AM 05/05/2020  ?  2:11 PM 04/11/2019  ?  9:07 AM 10/07/2016  ? 12:10 PM  ?Depression screen PHQ 2/9  ?Decreased Interest 0 0 0 0 0  ?Down, Depressed, Hopeless 0 0 0 0 0  ?PHQ - 2 Score 0 0 0 0 0  ? ? ?   ? ?Objective:  ?BP 132/90   Pulse 76   Temp 98.5 ?F (36.9 ?C)   Ht 5' 8.5" (1.74 m)   Wt 143 lb 3.2 oz (65 kg)   BMI 21.46 kg/m?  ? ?Physical Exam ?Vitals and nursing note reviewed.  ?Constitutional:   ?   General: He is not in acute distress. ?   Appearance: Normal appearance. He is not ill-appearing.  ?HENT:  ?   Head: Normocephalic and atraumatic.  ?   Right Ear: External ear normal.  ?   Left Ear: External ear normal.  ?   Nose: Nose normal.  ?   Mouth/Throat:  ?   Mouth: Mucous membranes are moist.  ?   Pharynx: Oropharynx is clear.  ?Eyes:  ?   Extraocular Movements: Extraocular movements intact.  ?   Conjunctiva/sclera: Conjunctivae normal.  ?   Pupils: Pupils are equal, round, and reactive to light.  ?Neck:  ?   Vascular: No carotid bruit.  ?Cardiovascular:  ?   Rate and Rhythm: Normal rate and regular rhythm.  ?   Pulses: Normal pulses.  ?   Heart sounds: Normal heart sounds.  ?Pulmonary:  ?   Effort: Pulmonary effort is normal.  ?   Breath sounds: Normal breath sounds.  ?Abdominal:  ?   General: Bowel sounds are normal. There is no distension.  ?   Palpations: Abdomen is soft. There is no mass.  ?   Tenderness: There is no abdominal tenderness.  ?   Hernia: No hernia is present.  ?Musculoskeletal:     ?   General: Tenderness present. No swelling or deformity. Normal range of motion.  ?   Cervical back: Normal range of motion and neck supple. No tenderness.  ?   Right lower leg: No edema.  ?   Left lower leg: No edema.  ?   Comments: Tender over right wrist in general with slightly reduced wrist ROM  ?Lymphadenopathy:  ?   Cervical: No cervical adenopathy.  ?Skin: ?   General: Skin is warm and dry.  ?   Capillary Refill: Capillary refill takes less than 2 seconds.  ?Neurological:  ?   General: No focal  deficit present.  ?   Mental Status: He is alert and oriented to person, place, and time. Mental status is at baseline.  ?   Cranial Nerves: No cranial nerve deficit.  ?   Sensory: No sensory deficit.  ?   Motor: No weakness.  ?   Gait: Gait normal.  ?   Deep Tendon Reflexes: Reflexes normal.  ?Psychiatric:     ?   Mood and Affect: Mood normal.     ?   Behavior: Behavior  normal.     ?   Judgment: Judgment normal.  ? ? ? ?Assessment and Plan :  ? ?Encounter Diagnoses  ?Name Primary?  ? Routine general medical examination at a health care facility Yes  ? Insulin dependent type 2 diabetes mellitus ([place])   ? Mixed hyperlipidemia   ? Aortic atherosclerosis ([place])   ? Benign prostatic hyperplasia with lower urinary tract symptoms, symptom details unspecified   ? Colon cancer screening   ? Family history of heart disease   ? Screen for colon cancer   ? Screening for prostate cancer   ? Vaccine refused by patient   ? Vaccine counseling   ? External hemorrhoid   ? Screening for thyroid disorder   ? Arcus senilis of both corneas   ? Right wrist pain   ? ? ?This visit was a preventative care visit, also known as wellness visit or routine physical.   Topics typically include healthy lifestyle, diet, exercise, preventative care, vaccinations, sick and well care, proper use of emergency dept and after hours care, as well as other concerns.   ? ? ?Recommendations: ?Continue to return yearly for your annual wellness and preventative care visits.  This gives [place] a chance to discuss healthy lifestyle, exercise, vaccinations, review your chart record, and perform screenings where appropriate. ? ?I recommend you see your eye doctor yearly for routine vision care. ? ?I recommend you see your dentist yearly for routine dental care including hygiene visits twice yearly. ? ? ?Vaccination recommendations were reviewed ?Immunization History  ?Administered Date(s) Administered  ? PFIZER(Purple Top)SARS-COV-2 Vaccination 03/09/2019, 04/10/2019,  12/18/2019  ? Td 05/05/2020  ? Tdap 08/15/2007  ? ?You declined vaccines today ? ?You are due for Shingles and Pneumcoccal vaccines ? ? ?Screening for cancer: ?Colon cancer screening: ?We will refer you for C

## 2021-05-06 NOTE — Assessment & Plan Note (Signed)
Continue statin. 

## 2021-05-06 NOTE — Assessment & Plan Note (Signed)
You have moderate hemorrhoids.  Consider seeing general surgeon for these ?

## 2021-05-07 ENCOUNTER — Other Ambulatory Visit: Payer: Self-pay | Admitting: Medical

## 2021-05-07 DIAGNOSIS — E782 Mixed hyperlipidemia: Secondary | ICD-10-CM

## 2021-05-07 LAB — COMPREHENSIVE METABOLIC PANEL
ALT: 15 IU/L (ref 0–44)
AST: 14 IU/L (ref 0–40)
Albumin/Globulin Ratio: 1.8 (ref 1.2–2.2)
Albumin: 4.6 g/dL (ref 3.8–4.9)
Alkaline Phosphatase: 89 IU/L (ref 44–121)
BUN/Creatinine Ratio: 10 (ref 9–20)
BUN: 8 mg/dL (ref 6–24)
Bilirubin Total: 0.3 mg/dL (ref 0.0–1.2)
CO2: 27 mmol/L (ref 20–29)
Calcium: 9.5 mg/dL (ref 8.7–10.2)
Chloride: 97 mmol/L (ref 96–106)
Creatinine, Ser: 0.82 mg/dL (ref 0.76–1.27)
Globulin, Total: 2.6 g/dL (ref 1.5–4.5)
Glucose: 97 mg/dL (ref 70–99)
Potassium: 4.4 mmol/L (ref 3.5–5.2)
Sodium: 135 mmol/L (ref 134–144)
Total Protein: 7.2 g/dL (ref 6.0–8.5)
eGFR: 104 mL/min/{1.73_m2} (ref >59)

## 2021-05-07 LAB — CBC
Hematocrit: 40.3 % (ref 37.5–51.0)
Hemoglobin: 14 g/dL (ref 13.0–17.7)
MCH: 30.8 pg (ref 26.6–33.0)
MCHC: 34.7 g/dL (ref 31.5–35.7)
MCV: 89 fL (ref 79–97)
Platelets: 254 10*3/uL (ref 150–450)
RBC: 4.54 x10E6/uL (ref 4.14–5.80)
RDW: 12.8 % (ref 11.6–15.4)
WBC: 4.7 10*3/uL (ref 3.4–10.8)

## 2021-05-07 LAB — URINALYSIS
Bilirubin, UA: NEGATIVE
Glucose, UA: NEGATIVE
Ketones, UA: NEGATIVE
Leukocytes,UA: NEGATIVE
Nitrite, UA: NEGATIVE
Protein,UA: NEGATIVE
RBC, UA: NEGATIVE
Specific Gravity, UA: 1.006 (ref 1.005–1.030)
Urobilinogen, Ur: 0.2 mg/dL (ref 0.2–1.0)
pH, UA: 6.5 (ref 5.0–7.5)

## 2021-05-07 LAB — LIPID PANEL
Chol/HDL Ratio: 2 ratio (ref 0.0–5.0)
Cholesterol, Total: 218 mg/dL — ABNORMAL HIGH (ref 100–199)
HDL: 109 mg/dL (ref >39)
LDL Chol Calc (NIH): 102 mg/dL — ABNORMAL HIGH (ref 0–99)
Triglycerides: 36 mg/dL (ref 0–149)
VLDL Cholesterol Cal: 7 mg/dL (ref 5–40)

## 2021-05-07 LAB — HEPATITIS C ANTIBODY: Hep C Virus Ab: NONREACTIVE

## 2021-05-07 LAB — MICROALBUMIN / CREATININE URINE RATIO
Creatinine, Urine: 28.3 mg/dL
Microalb/Creat Ratio: <11 mg/g creat (ref 0–29)
Microalbumin, Urine: <3 ug/mL

## 2021-05-07 LAB — PSA: Prostate Specific Ag, Serum: 0.6 ng/mL (ref 0.0–4.0)

## 2021-05-07 LAB — TSH: TSH: 1.63 u[IU]/mL (ref 0.450–4.500)

## 2021-05-07 LAB — HEMOGLOBIN A1C
Est. average glucose Bld gHb Est-mCnc: 157 mg/dL
Hgb A1c MFr Bld: 7.1 % — ABNORMAL HIGH (ref 4.8–5.6)

## 2021-05-07 MED ORDER — ROSUVASTATIN CALCIUM 10 MG PO TABS: 10.0000 mg | ORAL_TABLET | Freq: Every day | ORAL | 3 refills | Status: DC

## 2021-05-07 MED ORDER — BD PEN NEEDLE NANO U/F 32G X 4 MM MISC: 1.0000 | Freq: Three times a day (TID) | 11 refills | Status: DC

## 2021-05-07 MED ORDER — INSULIN DETEMIR 100 UNIT/ML FLEXPEN: 15.0000 [IU] | PEN_INJECTOR | Freq: Every day | SUBCUTANEOUS | 5 refills | Status: DC

## 2021-05-07 MED ORDER — ASPIRIN EC 81 MG PO TBEC: 81.0000 mg | DELAYED_RELEASE_TABLET | Freq: Every day | ORAL | 3 refills | Status: DC

## 2021-05-07 MED ORDER — NOVOLOG FLEXPEN 100 UNIT/ML ~~LOC~~ SOPN: 0.0000 [IU] | PEN_INJECTOR | Freq: Three times a day (TID) | SUBCUTANEOUS | 5 refills | Status: DC

## 2021-05-08 LAB — HM DIABETES EYE EXAM

## 2021-05-14 ENCOUNTER — Encounter: Payer: Self-pay | Admitting: Medical

## 2021-05-14 ENCOUNTER — Encounter: Payer: Self-pay | Admitting: Internal Medicine

## 2021-06-08 DIAGNOSIS — Z1211 Encounter for screening for malignant neoplasm of colon: Secondary | ICD-10-CM | POA: Diagnosis not present

## 2021-06-16 ENCOUNTER — Other Ambulatory Visit: Payer: Self-pay | Admitting: Medical

## 2021-06-16 DIAGNOSIS — Z1211 Encounter for screening for malignant neoplasm of colon: Secondary | ICD-10-CM

## 2021-06-16 DIAGNOSIS — R195 Other fecal abnormalities: Secondary | ICD-10-CM

## 2021-06-16 LAB — COLOGUARD: COLOGUARD: POSITIVE — AB

## 2021-07-22 ENCOUNTER — Telehealth: Payer: Self-pay | Admitting: Medical

## 2021-07-22 NOTE — Telephone Encounter (Signed)
Pt states he never got a call from the referral from 06/16/21

## 2021-08-10 ENCOUNTER — Encounter: Payer: Self-pay | Admitting: Gastroenterology

## 2021-09-03 ENCOUNTER — Ambulatory Visit (AMBULATORY_SURGERY_CENTER): Payer: BC Managed Care – PPO

## 2021-09-03 VITALS — Ht 68.5 in | Wt 135.0 lb

## 2021-09-03 DIAGNOSIS — R195 Other fecal abnormalities: Secondary | ICD-10-CM

## 2021-09-03 MED ORDER — NA SULFATE-K SULFATE-MG SULF 17.5-3.13-1.6 GM/177ML PO SOLN: 1.0000 | ORAL | 0 refills | Status: DC

## 2021-09-03 NOTE — Progress Notes (Signed)
No egg or soy allergy known to patient  No issues known to pt with past sedation with any surgeries or procedures Patient denies ever being told they had issues or difficulty with intubation  No FH of Malignant Hyperthermia Pt is not on diet pills Pt is not on  home 02  Pt is not on blood thinners  Pt denies issues with constipation  No A fib or A flutter Have any cardiac testing pending--denied Pt instructed to use Singlecare.com or GoodRx for a price reduction on prep   

## 2021-09-11 ENCOUNTER — Encounter: Payer: Self-pay | Admitting: Gastroenterology

## 2021-09-25 ENCOUNTER — Ambulatory Visit (AMBULATORY_SURGERY_CENTER): Payer: BC Managed Care – PPO | Admitting: Gastroenterology

## 2021-09-25 ENCOUNTER — Encounter: Payer: Self-pay | Admitting: Gastroenterology

## 2021-09-25 VITALS — BP 130/85 | HR 83 | Temp 98.4°F | Resp 22 | Ht 68.5 in | Wt 135.0 lb

## 2021-09-25 DIAGNOSIS — D123 Benign neoplasm of transverse colon: Secondary | ICD-10-CM

## 2021-09-25 DIAGNOSIS — Z1211 Encounter for screening for malignant neoplasm of colon: Secondary | ICD-10-CM | POA: Diagnosis not present

## 2021-09-25 DIAGNOSIS — R195 Other fecal abnormalities: Secondary | ICD-10-CM

## 2021-09-25 DIAGNOSIS — D125 Benign neoplasm of sigmoid colon: Secondary | ICD-10-CM

## 2021-09-25 DIAGNOSIS — D122 Benign neoplasm of ascending colon: Secondary | ICD-10-CM

## 2021-09-25 DIAGNOSIS — D120 Benign neoplasm of cecum: Secondary | ICD-10-CM | POA: Diagnosis not present

## 2021-09-25 DIAGNOSIS — K649 Unspecified hemorrhoids: Secondary | ICD-10-CM

## 2021-09-25 MED ORDER — SODIUM CHLORIDE 0.9 % IV SOLN: 500.0000 mL | INTRAVENOUS | Status: DC

## 2021-09-25 NOTE — Progress Notes (Signed)
Approx 0846, pt HR suddenly drops (we were withdrawing).  HR corrects on its own but pt starts vomitting bile colored fluid.  HOB dropped to steep [place], Suctioning oropharynx started, sedation halted.  Before pt alert, emesis turns brown.  Dr A notified.  Suctioning coninued until pt alert enough to swallow and cough to clear airway on command.  Sats did drop.  BS sounded clear in room.  Sedation never restarted  Dr A aware.  Approx 250cc in cannister, some on pillow case and washcloth

## 2021-09-25 NOTE — Patient Instructions (Signed)
Information on polyps and diverticulosis given to you today.  Await pathology results.  Resume previous diet and medications.   Avoid NSAIDS (Aspirin, Ibuprofen, Aleve, Naproxen), you may use Tylenol as needed.    YOU HAD AN ENDOSCOPIC PROCEDURE TODAY AT [facility]:   Refer to the procedure report that was given to you for any specific questions about what was found during the examination.  If the procedure report does not answer your questions, please call your gastroenterologist to clarify.  If you requested that your care partner not be given the details of your procedure findings, then the procedure report has been included in a sealed envelope for you to review at your convenience later.  YOU SHOULD EXPECT: Some feelings of bloating in the abdomen. Passage of more gas than usual.  Walking can help get rid of the air that was put into your GI tract during the procedure and reduce the bloating. If you had a lower endoscopy (such as a colonoscopy or flexible sigmoidoscopy) you may notice spotting of blood in your stool or on the toilet paper. If you underwent a bowel prep for your procedure, you may not have a normal bowel movement for a few days.  Please Note:  You might notice some irritation and congestion in your nose or some drainage.  This is from the oxygen used during your procedure.  There is no need for concern and it should clear up in a day or so.  SYMPTOMS TO REPORT IMMEDIATELY:  Following lower endoscopy (colonoscopy or flexible sigmoidoscopy):  Excessive amounts of blood in the stool  Significant tenderness or worsening of abdominal pains  Swelling of the abdomen that is new, acute  Fever of 100F or higher   For urgent or emergent issues, a gastroenterologist can be reached at any hour by calling [phone]. Do not use MyChart messaging for urgent concerns.    DIET:  We do recommend a small meal at first, but then you may proceed to your regular  diet.  Drink plenty of fluids but you should avoid alcoholic beverages for 24 hours.  ACTIVITY:  You should plan to take it easy for the rest of today and you should NOT DRIVE or use heavy machinery until tomorrow (because of the sedation medicines used during the test).    FOLLOW UP: Our staff will call the number listed on your records the next business day following your procedure.  We will call around 7:15- 8:00 am to check on you and address any questions or concerns that you may have regarding the information given to you following your procedure. If we do not reach you, we will leave a message.     If any biopsies were taken you will be contacted by phone or by letter within the next 1-3 weeks.  Please call us at [phone] if you have not heard about the biopsies in 3 weeks.    SIGNATURES/CONFIDENTIALITY: You and/or your care partner have signed paperwork which will be entered into your electronic medical record.  These signatures attest to the fact that that the information above on your After Visit Summary has been reviewed and is understood.  Full responsibility of the confidentiality of this discharge information lies with you and/or your care-partner.

## 2021-09-25 NOTE — Progress Notes (Signed)
Report to PACU, RN, vss, BBS= Clear.  

## 2021-09-25 NOTE — Progress Notes (Signed)
Called to room to assist during endoscopic procedure.  Patient ID and intended procedure confirmed with present staff. Received instructions for my participation in the procedure from the performing physician.  

## 2021-09-25 NOTE — Progress Notes (Signed)
[facility] History and Physical   Primary Care Physician:  Richard Hurl, PA-C   Reason for Procedure:   Positive Cologuard  Plan:    colonoscopy     HPI: Richard Berg is a 56 y.o. male  here for colonoscopy to evaluate positive cologuard. Patient denies any bowel symptoms at this time. No family history of colon cancer known. Otherwise feels well without any cardiopulmonary symptoms.   I have discussed risks / benefits of anesthesia and endoscopic procedure with Richard Berg and they wish to proceed with the exams as outlined today.    Past Medical History:  Diagnosis Date   Constipation    Diabetes mellitus    dx'd 02/15/11   Family history of premature CAD    father MI in early 40s   Hepatitis B immune 03/2014   per titer   Hyperlipidemia    Positive TB test    "took the treatment ~ 1993"    Past Surgical History:  Procedure Laterality Date   COLONOSCOPY  2011    Prior to Admission medications   Medication Sig Start Date End Date Taking? Authorizing Provider  aspirin EC 81 MG tablet Take 1 tablet (81 mg total) by mouth daily. 05/07/21  Yes Tysinger, Camelia Eng, PA-C  rosuvastatin (CRESTOR) 10 MG tablet Take 1 tablet (10 mg total) by mouth daily. 05/07/21 05/07/22 Yes Tysinger, Camelia Eng, PA-C  insulin aspart (NOVOLOG FLEXPEN) 100 UNIT/ML FlexPen Inject 0-15 Units into the skin 3 (three) times daily with meals. 05/07/21   Tysinger, Camelia Eng, PA-C  insulin detemir (LEVEMIR) 100 UNIT/ML FlexPen Inject 15 Units into the skin daily at 10 pm. 05/07/21   Tysinger, Camelia Eng, PA-C  Insulin Pen Needle (BD PEN NEEDLE NANO U/F) 32G X 4 MM MISC 1 each by Does not apply route 3 (three) times daily. 05/07/21   Tysinger, Camelia Eng, PA-C    Current Outpatient Medications  Medication Sig Dispense Refill   aspirin EC 81 MG tablet Take 1 tablet (81 mg total) by mouth daily. 90 tablet 3   rosuvastatin (CRESTOR) 10 MG tablet Take 1 tablet (10 mg total) by mouth daily. 90 tablet 3    insulin aspart (NOVOLOG FLEXPEN) 100 UNIT/ML FlexPen Inject 0-15 Units into the skin 3 (three) times daily with meals. 15 mL 5   insulin detemir (LEVEMIR) 100 UNIT/ML FlexPen Inject 15 Units into the skin daily at 10 pm. 3 mL 5   Insulin Pen Needle (BD PEN NEEDLE NANO U/F) 32G X 4 MM MISC 1 each by Does not apply route 3 (three) times daily. 100 each 11   Current Facility-Administered Medications  Medication Dose Route Frequency Provider Last Rate Last Admin   0.9 %  sodium chloride infusion  500 mL Intravenous Continuous Richard Berg, Carlota Raspberry, MD        Allergies as of 09/25/2021 - Review Complete 09/25/2021  Allergen Reaction Noted   Lisinopril Swelling 08/13/2015   Penicillins Hives 02/15/2011    Family History  Problem Relation Age of Onset   Cancer Mother        breast   Hypertension Father    Heart disease Father 89       died of MI   Hypertension Sister    Heart disease Brother    Arrhythmia Brother    Diabetes type II Paternal Grandmother    Colon polyps Neg Hx    Colon cancer Neg Hx    Esophageal cancer Neg Hx  Stomach cancer Neg Hx    Rectal cancer Neg Hx     Social History   Socioeconomic History   Marital status: Married    Spouse name: Not on file   Number of children: Not on file   Years of education: Not on file   Highest education level: Not on file  Occupational History   Not on file  Tobacco Use   Smoking status: Some Days    Types: Cigars   Smokeless tobacco: Former    Types: Chew   Tobacco comments:    cigars currently 2021  Substance and Sexual Activity   Alcohol use: Yes    Alcohol/week: 10.0 standard drinks of alcohol    Types: 10 Shots of liquor per week   Drug use: Not Currently    Types: Marijuana    Comment: marijuana "last time ~ 1987"   Sexual activity: Yes  Other Topics Concern   Not on file  Social History Narrative   Married, 1 child, works in [employer] for [employer].    05/2021   Social Determinants of Health    Financial Resource Strain: Not on file  Food Insecurity: Not on file  Transportation Needs: Not on file  Physical Activity: Not on file  Stress: Not on file  Social Connections: Not on file  Intimate Partner Violence: Not on file    Review of Systems: All other review of systems negative except as mentioned in the HPI.  Physical Exam: Vital signs BP 119/75   Pulse 98   Temp 98.4 F (36.9 C)   Ht 5' 8.5" (1.74 m)   Wt 135 lb (61.2 kg)   SpO2 99%   BMI 20.23 kg/m   General:   Alert,  Well-developed, pleasant and cooperative in NAD Lungs:  Clear throughout to auscultation.   Heart:  Regular rate and rhythm Abdomen:  Soft, nontender and nondistended.   Neuro/Psych:  Alert and cooperative. Normal mood and affect. A and O x 3  Jolly Mango, MD [facility]

## 2021-09-25 NOTE — Op Note (Addendum)
[name] Procedure Date: 09/25/2021 8:17 AM MRN: [id number] Endoscopist: Richard Berg , MD Age: 56 Referring MD:  Date of Birth: 21-Mar-1965 Gender: Male Account #: [id number] Procedure:                Colonoscopy Indications:              Positive Cologuard test Medicines:                Monitored Anesthesia Care Procedure:                Pre-Anesthesia Assessment:                           - Prior to the procedure, a History and Physical                            was performed, and patient medications and                            allergies were reviewed. The patient's tolerance of                            previous anesthesia was also reviewed. The risks                            and benefits of the procedure and the sedation                            options and risks were discussed with the patient.                            All questions were answered, and informed consent                            was obtained. Prior Anticoagulants: The patient has                            taken no previous anticoagulant or antiplatelet                            agents. ASA Grade Assessment: II - A patient with                            mild systemic disease. After reviewing the risks                            and benefits, the patient was deemed in                            satisfactory condition to undergo the procedure.                           After obtaining informed consent, the colonoscope  was passed under direct vision. Throughout the                            procedure, the patient's blood pressure, pulse, and                            oxygen saturations were monitored continuously. The                            PCF-HQ190L Colonoscope was introduced through the                            anus and advanced to the the cecum, identified by                            appendiceal orifice and  ileocecal valve. The                            colonoscopy was performed without difficulty. The                            patient tolerated the procedure well. The quality                            of the bowel preparation was good. The ileocecal                            valve, appendiceal orifice, and rectum were                            photographed. Scope In: 8:27:15 AM Scope Out: 9:06:49 AM Scope Withdrawal Time: 0 hours 33 minutes 31 seconds  Total Procedure Duration: 0 hours 39 minutes 34 seconds  Findings:                 Hemorrhoids were found on perianal exam.                           A 3 mm polyp was found in the cecum. The polyp was                            sessile. The polyp was removed with a cold snare.                            Resection and retrieval were complete.                           A diminutive polyp was found in the ascending                            colon. The polyp was sessile. The polyp was removed                            with a cold snare. Resection and  retrieval were                            complete.                           A 4 mm polyp was found in the transverse colon. The                            polyp was sessile. The polyp was removed with a                            cold snare. Resection and retrieval were complete.                           A roughly 40 mm polyp was found in the sigmoid                            colon. The polyp was pedunculated with a thick                            stalk. Area was successfully injected with 4 mL of                            a diluted 1:10,000 solution of epinephrine for drug                            delivery. The polyp was then removed with a hot                            snare. Resection and retrieval were complete. Of                            note, the polyp head was large, it took several                            minutes to eventually get to a position to allow                             the snare to get over the head of the polyp and                            down on the stalk. The stalk was thick, 2                            hemostasis clips placed to reduce risk for                            bleeding. Area distal to the lesion was tattooed                            with an injection of Spot (carbon black). There  was                            no bleeding at the end of the procedure.                           Multiple small-mouthed diverticula were found in                            the transverse colon, left colon and right colon.                           Internal hemorrhoids were found during retroflexion.                           The exam was otherwise without abnormality. Complications:            No immediate complications. Estimated blood loss:                            Minimal. Patient had an episode of vomiting and                            wretching during the exam, suctioned per                            anesthesia, no oxygen desaturation noted Estimated Blood Loss:     Estimated blood loss was minimal. Impression:               - Hemorrhoids found on perianal exam.                           - One 3 mm polyp in the cecum, removed with a cold                            snare. Resected and retrieved.                           - One diminutive polyp in the ascending colon,                            removed with a cold snare. Resected and retrieved.                           - One 4 mm polyp in the transverse colon, removed                            with a cold snare. Resected and retrieved.                           - One roughly 40 mm polyp in the sigmoid colon,                            removed with a hot snare as outlined above.  Resected and retrieved. Injected. Tattooed. Clips                            were placed.                           - Diverticulosis in the transverse colon, in the                             left colon and in the right colon.                           - Internal hemorrhoids.                           - The examination was otherwise normal. Recommendation:           - Patient has a contact number available for                            emergencies. The signs and symptoms of potential                            delayed complications were discussed with the                            patient. Return to normal activities tomorrow.                            Written discharge instructions were provided to the                            patient.                           - Resume previous diet.                           - Continue present medications.                           - Await pathology results.                           - No ibuprofen, naproxen, or other non-steroidal                            anti-inflammatory drugs for 2 weeks after polyp                            removal. Richard Lipps P. Richard Bilger, MD 09/25/2021 9:16:50 AM This report has been signed electronically.

## 2021-09-28 ENCOUNTER — Telehealth: Payer: Self-pay | Admitting: *Deleted

## 2021-09-28 NOTE — Telephone Encounter (Signed)
  Follow up Call-     09/25/2021    7:39 AM  Call back number  Post procedure Call Back phone  # [phone]  Permission to leave phone message Yes     Patient questions:  Do you have a fever, pain , or abdominal swelling? No. Pain Score  0 *  Have you tolerated food without any problems? Yes.    Have you been able to return to your normal activities? Yes.    Do you have any questions about your discharge instructions: Diet   No. Medications  No. Follow up visit  No.  Do you have questions or concerns about your Care? No.  Actions: * If pain score is 4 or above: No action needed, pain <4.

## 2021-10-02 ENCOUNTER — Ambulatory Visit: Payer: BC Managed Care – PPO | Admitting: Medical

## 2021-10-02 VITALS — BP 120/62 | HR 96 | Temp 97.1°F | Wt 135.2 lb

## 2021-10-02 DIAGNOSIS — Z794 Long term (current) use of insulin: Secondary | ICD-10-CM | POA: Diagnosis not present

## 2021-10-02 DIAGNOSIS — M7022 Olecranon bursitis, left elbow: Secondary | ICD-10-CM | POA: Diagnosis not present

## 2021-10-02 DIAGNOSIS — M25522 Pain in left elbow: Secondary | ICD-10-CM | POA: Diagnosis not present

## 2021-10-02 DIAGNOSIS — E119 Type 2 diabetes mellitus without complications: Secondary | ICD-10-CM

## 2021-10-02 NOTE — Patient Instructions (Signed)
Encounter Diagnoses  Name Primary?   Left elbow pain Yes   Olecranon bursitis of left elbow    Insulin dependent type 2 diabetes mellitus ([place])     Recommendations: I recommend Aleve over-the-counter once or twice daily for the next 7 to 10 days for pain and inflammation I recommend ice water therapy or cool pack therapy 20 minutes a couple times a day to reduce pain and swelling Consider an elbow sleeve or Ace wrap for compression.  You can find some elbow sleeve with padding to pad the area as well Avoid putting a lot of pressure on the elbow such as resting the elbow on the table for now Hopefully things will gradually resolve over the next 2 weeks It is worse in the meantime other treatment could include aspiration of fluid off the elbow, steroid injection, steroid by mouth or other medication for pain Consider a baseline x-ray of the elbow

## 2021-10-02 NOTE — Progress Notes (Signed)
Subjective:  Richard Berg is a 56 y.o. male who presents for Chief Complaint  Patient presents with   Joint Swelling    Elbow swelling x 1 month. Hit it on the door in house and then door latch on car door a couple days later     Hit elbow on table and getting out of the car a month ago.  Had been hurting, but started swelling a few weeks ago.  Staying swollen.   No redness, no heat, no body aches, no chills, no fever.  No drainage.   It hasn't been bigger.  Rest of arm is fine.  Hurts to put pressure right on elbow bone.  Using nothing for treatment.  No other aggravating or relieving factors.    No other c/o.  Past Medical History:  Diagnosis Date   Constipation    Diabetes mellitus    dx'd 02/15/11   Family history of premature CAD    father MI in early 6s   Hepatitis B immune 03/2014   per titer   Hyperlipidemia    Positive TB test    "took the treatment ~ 1993"   Current Outpatient Medications on File Prior to Visit  Medication Sig Dispense Refill   aspirin EC 81 MG tablet Take 1 tablet (81 mg total) by mouth daily. 90 tablet 3   insulin aspart (NOVOLOG FLEXPEN) 100 UNIT/ML FlexPen Inject 0-15 Units into the skin 3 (three) times daily with meals. 15 mL 5   insulin detemir (LEVEMIR) 100 UNIT/ML FlexPen Inject 15 Units into the skin daily at 10 pm. 3 mL 5   Insulin Pen Needle (BD PEN NEEDLE NANO U/F) 32G X 4 MM MISC 1 each by Does not apply route 3 (three) times daily. 100 each 11   rosuvastatin (CRESTOR) 10 MG tablet Take 1 tablet (10 mg total) by mouth daily. 90 tablet 3   No current facility-administered medications on file prior to visit.     The following portions of the patient's history were reviewed and updated as appropriate: allergies, current medications, past family history, past medical history, past social history, past surgical history and problem list.  ROS Otherwise as in subjective above  Objective: BP 120/62   Pulse 96   Temp (!) 97.1 F (36.2  C)   Wt 135 lb 3.2 oz (61.3 kg)   BMI 20.26 kg/m   General appearance: alert, no distress, well developed, well nourished MSK: Left elbow with some localized tenderness over the olecranon, there is some soft tissue swelling mild to moderate without obvious induration fluctuance warmth or redness.  Elbow range of motion normal.  Rest of arm unremarkable Arms neurovascularly intact    Assessment: Encounter Diagnoses  Name Primary?   Left elbow pain Yes   Olecranon bursitis of left elbow    Insulin dependent type 2 diabetes mellitus ([place])      Plan: Discussed symptoms, exam findings and diagnosis  Recommendations: I recommend Aleve over-the-counter once or twice daily for the next 7 to 10 days for pain and inflammation I recommend ice water therapy or cool pack therapy 20 minutes a couple times a day to reduce pain and swelling Consider an elbow sleeve or Ace wrap for compression.  You can find some elbow sleeve with padding to pad the area as well Avoid putting a lot of pressure on the elbow such as resting the elbow on the table for now Hopefully things will gradually resolve over the next 2 weeks It is  worse in the meantime other treatment could include aspiration of fluid off the elbow, steroid injection, steroid by mouth or other medication for pain Consider a baseline x-ray of the elbow  [name] was seen today for joint swelling.  Diagnoses and all orders for this visit:  Left elbow pain  Olecranon bursitis of left elbow  Insulin dependent type 2 diabetes mellitus (Richard Berg)    Follow up: prn

## 2021-10-13 ENCOUNTER — Encounter: Payer: Self-pay | Admitting: Internal Medicine

## 2021-10-14 ENCOUNTER — Telehealth: Payer: Self-pay | Admitting: Gastroenterology

## 2021-10-14 NOTE — Telephone Encounter (Signed)
[place] his procedure was almost 3 weeks ago, I think post polypectomy bleed is unlikely. Very well could be due to hemorrhoids noted on the colonoscopy. He should try some 1% hydrocortisone, pea sized amount PR BID To see if that helps. He can also try some Calmol4 suppositories OTC PRN To see if that helps. If his bleeding persists or worsens despite this he should let us know, would consider hemorrhoid banding. You can book him a follow up with me as well to discuss this further. If he has any significant bleeding that is not his typical bleeding he should otherwise let us know. Thanks

## 2021-10-14 NOTE — Telephone Encounter (Signed)
Returned call to patient. He states that the same day that he received his pathology results he felt like he had to pass gas and when he did it was "wet". Pt thought that maybe he had to have a BM, and at that point he passed some stool and "a lot of blood". Pt states that the bleeding is intermittent and bright red. He usually notices the blood in the commode water and when he wipes. Pt wondered if it could be irritated hemorrhoids, he also had a large polyp removed. Patient states that the last time he noticed the blood was yesterday morning. He is not on any anticoagulation therapy other than aspirin daily. He denies any SOB, dizziness, or fatigue. Pt states that he had similar symptoms prior to the colonoscopy. He has not tried treating this as hemorrhoidal bleeding. Please advise, thanks.

## 2021-10-14 NOTE — Telephone Encounter (Signed)
Inbound call from patient stating he is having some bleeding. Please give patient a call back to further advise.

## 2021-10-15 NOTE — Telephone Encounter (Signed)
Lm on vm for patient to return call 

## 2021-10-15 NOTE — Telephone Encounter (Signed)
Returned call to patient. He states that he noticed a little bleeding today, typical to what he has seen nothing significant. Pt will try recommendations for 1% Hydrocortisone Cream PR BID and Calmol4 suppositories OTC. Pt knows to contact if his symptoms worsen. Pt verbalized understanding and had no concerns at the end of the call.

## 2021-10-15 NOTE — Telephone Encounter (Signed)
Patient is returning your call.  

## 2021-11-09 ENCOUNTER — Ambulatory Visit
Admission: RE | Admit: 2021-11-09 | Discharge: 2021-11-09 | Disposition: A | Discharge status: Home / Self Care | Payer: BC Managed Care – PPO | Source: Ambulatory Visit | Attending: Medical | Admitting: Medical

## 2021-11-09 ENCOUNTER — Ambulatory Visit (INDEPENDENT_AMBULATORY_CARE_PROVIDER_SITE_OTHER): Payer: BC Managed Care – PPO | Admitting: Medical

## 2021-11-09 VITALS — BP 110/70 | HR 95 | Wt 136.4 lb

## 2021-11-09 DIAGNOSIS — M25522 Pain in left elbow: Secondary | ICD-10-CM

## 2021-11-09 DIAGNOSIS — Z794 Long term (current) use of insulin: Secondary | ICD-10-CM

## 2021-11-09 DIAGNOSIS — E119 Type 2 diabetes mellitus without complications: Secondary | ICD-10-CM | POA: Diagnosis not present

## 2021-11-09 DIAGNOSIS — I700 Atherosclerosis of aorta: Secondary | ICD-10-CM

## 2021-11-09 DIAGNOSIS — E782 Mixed hyperlipidemia: Secondary | ICD-10-CM

## 2021-11-09 DIAGNOSIS — Z7185 Encounter for immunization safety counseling: Secondary | ICD-10-CM

## 2021-11-09 DIAGNOSIS — Z2820 Immunization not carried out because of patient decision for unspecified reason: Secondary | ICD-10-CM

## 2021-11-09 NOTE — Progress Notes (Signed)
Subjective: Chief Complaint  Patient presents with   Diabetes    Diabetes check, no concerns, declines flu or covid   Here for med check  Diabetes He presents for his follow-up diabetic visit. He has type 2 diabetes mellitus. There are no hypoglycemic associated symptoms. Symptoms are stable.  He continues on Levemir 15 units nightly and NovoLog sliding scale  Hyperlipidemia-prior to his physical earlier this year he was not taking Crestor but he did start back since last visit.  He reports compliance with Crestor 10 mg daily.  No side effects of medication  He continues to have elbow pain.  We saw him in September after he had bumped his elbow and had swelling.  He is nowhere near as swollen now but still getting some tenderness.  No other aggravating or relieving factors. No other complaint.   Past Medical History:  Diagnosis Date   Constipation    Diabetes mellitus    dx'd 02/15/11   Family history of premature CAD    father MI in early 45s   Hepatitis B immune 03/2014   per titer   Hyperlipidemia    Positive TB test    "took the treatment ~ 1993"   Current Outpatient Medications on File Prior to Visit  Medication Sig Dispense Refill   aspirin EC 81 MG tablet Take 1 tablet (81 mg total) by mouth daily. 90 tablet 3   insulin aspart (NOVOLOG FLEXPEN) 100 UNIT/ML FlexPen Inject 0-15 Units into the skin 3 (three) times daily with meals. 15 mL 5   insulin detemir (LEVEMIR) 100 UNIT/ML FlexPen Inject 15 Units into the skin daily at 10 pm. 3 mL 5   Insulin Pen Needle (BD PEN NEEDLE NANO U/F) 32G X 4 MM MISC 1 each by Does not apply route 3 (three) times daily. 100 each 11   rosuvastatin (CRESTOR) 10 MG tablet Take 1 tablet (10 mg total) by mouth daily. 90 tablet 3   No current facility-administered medications on file prior to visit.   ROS as in subjective  Objective: BP 110/70   Pulse 95   Wt 136 lb 6.4 oz (61.9 kg)   BMI 20.44 kg/m   General well-developed  well-nourished no acute distress Tender over left olecranon, still mild puffiness but nowhere near as swollen as September, otherwise elbow nontender with normal range of motion, no discoloration Heart: Regular rate and rhythm, normal S1-S2, no murmurs Lungs clear Extremities without edema  Diabetic Foot Exam - Simple   Simple Foot Form Diabetic Foot exam was performed with the following findings: Yes 11/09/2021  8:51 AM  Visual Inspection No deformities, no ulcerations, no other skin breakdown bilaterally: Yes Sensation Testing Intact to touch and monofilament testing bilaterally: Yes Pulse Check Posterior Tibialis and Dorsalis pulse intact bilaterally: Yes Comments      Assessment: Encounter Diagnoses  Name Primary?   Insulin dependent type 2 diabetes mellitus ([place]) Yes   Aortic atherosclerosis ([facility])    Vaccine counseling    Vaccine refused by patient    Mixed hyperlipidemia    Left elbow pain      Plan: Diabetes-updated labs today, continue current medications, continue daily foot checks, yearly visit  Hyperlipidemia, aortic atherosclerosis-recheck labs and starting back on statin from his physical in May  Left elbow pain-improved from September 2023 bursitis but is still giving quite a bit of discomfort.  We will send for updated x-ray  We discussed recommended vaccines today, flu, shingles, but he declines vaccines to  I  reviewed his July 2022 cardiac testing including coronary calcium score 0 and echocardiogram reviewed  [place] was seen today for diabetes.  Diagnoses and all orders for this visit:  Insulin dependent type 2 diabetes mellitus ([place]) -     Hemoglobin A1c  Aortic atherosclerosis ([facility]) -     Lipid panel  Vaccine counseling  Vaccine refused by patient  Mixed hyperlipidemia -     Lipid panel -     ALT  Left elbow pain -     Uric acid -     DG Elbow Complete Left; Future    F/u pending labs, xray

## 2021-11-09 NOTE — Patient Instructions (Signed)
Please go to [facility] for your left elbow xray.   Their hours are 8am - 4:30 pm Monday - Friday.  Take your insurance card with you.  [facility] [phone]  [address], [place], [place] [address]  [address] [place], [place] [address]

## 2021-11-11 ENCOUNTER — Other Ambulatory Visit: Payer: Self-pay | Admitting: Medical

## 2021-11-11 LAB — LIPID PANEL
Chol/HDL Ratio: 2 ratio (ref 0.0–5.0)
Cholesterol, Total: 229 mg/dL — ABNORMAL HIGH (ref 100–199)
HDL: 112 mg/dL (ref >39)
LDL Chol Calc (NIH): 109 mg/dL — ABNORMAL HIGH (ref 0–99)
Triglycerides: 44 mg/dL (ref 0–149)
VLDL Cholesterol Cal: 8 mg/dL (ref 5–40)

## 2021-11-11 LAB — ALT: ALT: 14 IU/L (ref 0–44)

## 2021-11-11 LAB — HEMOGLOBIN A1C
Est. average glucose Bld gHb Est-mCnc: 137 mg/dL
Hgb A1c MFr Bld: 6.4 % — ABNORMAL HIGH (ref 4.8–5.6)

## 2021-11-11 LAB — URIC ACID: Uric Acid: 4.3 mg/dL (ref 3.8–8.4)

## 2021-11-11 MED ORDER — INSULIN DETEMIR 100 UNIT/ML FLEXPEN: 15.0000 [IU] | PEN_INJECTOR | Freq: Every day | SUBCUTANEOUS | 5 refills | Status: DC

## 2021-11-11 MED ORDER — BD PEN NEEDLE NANO U/F 32G X 4 MM MISC: 1.0000 | Freq: Three times a day (TID) | 11 refills | Status: DC

## 2021-11-11 MED ORDER — NOVOLOG FLEXPEN 100 UNIT/ML ~~LOC~~ SOPN: 0.0000 [IU] | PEN_INJECTOR | Freq: Three times a day (TID) | SUBCUTANEOUS | 5 refills | Status: DC

## 2021-11-11 MED ORDER — ROSUVASTATIN CALCIUM 20 MG PO TABS: 20.0000 mg | ORAL_TABLET | Freq: Every day | ORAL | 3 refills | Status: DC

## 2021-12-05 DIAGNOSIS — J029 Acute pharyngitis, unspecified: Secondary | ICD-10-CM | POA: Diagnosis not present

## 2021-12-23 ENCOUNTER — Ambulatory Visit (INDEPENDENT_AMBULATORY_CARE_PROVIDER_SITE_OTHER): Payer: BC Managed Care – PPO | Admitting: Medical

## 2021-12-23 ENCOUNTER — Encounter: Payer: Self-pay | Admitting: Medical

## 2021-12-23 VITALS — BP 138/88 | HR 81 | Temp 98.5°F | Wt 135.8 lb

## 2021-12-23 DIAGNOSIS — J029 Acute pharyngitis, unspecified: Secondary | ICD-10-CM | POA: Diagnosis not present

## 2021-12-23 LAB — CBC WITH DIFFERENTIAL/PLATELET
Basophils Absolute: 0 10*3/uL (ref 0.0–0.2)
Basos: 1 %
EOS (ABSOLUTE): 0.1 10*3/uL (ref 0.0–0.4)
Eos: 1 %
Hematocrit: 35 % — ABNORMAL LOW (ref 37.5–51.0)
Hemoglobin: 11.3 g/dL — ABNORMAL LOW (ref 13.0–17.7)
Immature Grans (Abs): 0 10*3/uL (ref 0.0–0.1)
Immature Granulocytes: 0 %
Lymphocytes Absolute: 2.2 10*3/uL (ref 0.7–3.1)
Lymphs: 44 %
MCH: 28.7 pg (ref 26.6–33.0)
MCHC: 32.3 g/dL (ref 31.5–35.7)
MCV: 89 fL (ref 79–97)
Monocytes Absolute: 0.8 10*3/uL (ref 0.1–0.9)
Monocytes: 16 %
Neutrophils Absolute: 1.9 10*3/uL (ref 1.4–7.0)
Neutrophils: 38 %
Platelets: 279 10*3/uL (ref 150–450)
RBC: 3.94 x10E6/uL — ABNORMAL LOW (ref 4.14–5.80)
RDW: 14.3 % (ref 11.6–15.4)
WBC: 5 10*3/uL (ref 3.4–10.8)

## 2021-12-23 LAB — POCT MONO (EPSTEIN BARR VIRUS): Mono, POC: NEGATIVE

## 2021-12-23 MED ORDER — OMEPRAZOLE 40 MG PO CPDR: 40.0000 mg | DELAYED_RELEASE_CAPSULE | Freq: Every day | ORAL | 0 refills | Status: DC

## 2021-12-23 NOTE — Progress Notes (Signed)
Subjective:  Richard Berg is a 56 y.o. male who presents for Chief Complaint  Patient presents with   other    ST since Nov. Did not have appetite on Thanksgiving, ST really bad later that night,  went to Urgent care 12/11/21 took a z-pak for 4 days and lidocaine solution, throat started hurting again about 2 weeks ago,      Here for ongoing sore throat since thanksgiving.  Initially sore throat, no appetite, did some salt water gargles,  throat lozenges.  Symptoms persisted.  Went to urgent care 12/11/21,they prescribed zpak and lidocaine solution.   Got better then started up again 2 weeks ago.  Some heartburn.  No nasal congestion. Otherwise feels fine.  No other aggravating or relieving factors.    No other c/o.  Past Medical History:  Diagnosis Date   Constipation    Diabetes mellitus    dx'd 02/15/11   Family history of premature CAD    father MI in early 44s   Hepatitis B immune 03/2014   per titer   Hyperlipidemia    Positive TB test    "took the treatment ~ 1993"   Current Outpatient Medications on File Prior to Visit  Medication Sig Dispense Refill   aspirin EC 81 MG tablet Take 1 tablet (81 mg total) by mouth daily. 90 tablet 3   insulin aspart (NOVOLOG FLEXPEN) 100 UNIT/ML FlexPen Inject 0-15 Units into the skin 3 (three) times daily with meals. 15 mL 5   insulin detemir (LEVEMIR) 100 UNIT/ML FlexPen Inject 15 Units into the skin daily at 10 pm. 3 mL 5   Insulin Pen Needle (BD PEN NEEDLE NANO U/F) 32G X 4 MM MISC 1 each by Does not apply route 3 (three) times daily. 100 each 11   rosuvastatin (CRESTOR) 20 MG tablet Take 1 tablet (20 mg total) by mouth daily. 90 tablet 3   No current facility-administered medications on file prior to visit.     The following portions of the patient's history were reviewed and updated as appropriate: allergies, current medications, past family history, past medical history, past social history, past surgical history and problem  list.  ROS Otherwise as in subjective above  Objective: BP 138/88   Pulse 81   Temp 98.5 F (36.9 C)   Wt 135 lb 12.8 oz (61.6 kg)   BMI 20.35 kg/m   BP Readings from Last 3 Encounters:  12/23/21 138/88  11/09/21 110/70  10/02/21 120/62   Wt Readings from Last 3 Encounters:  12/23/21 135 lb 12.8 oz (61.6 kg)  11/09/21 136 lb 6.4 oz (61.9 kg)  10/02/21 135 lb 3.2 oz (61.3 kg)   General appearance: alert, no distress, well developed, well nourished HEENT: normocephalic, sclerae anicteric, conjunctiva pink and moist, TMs pearly, nares patent, no discharge or erythema, pharynx with mild tonsil enlargement but no erythema, no exudate Oral cavity: MMM, no lesions Neck: supple, no lymphadenopathy, no thyromegaly, no masses Lungs: CTA bilaterally, no wheezes, rhonchi, or rales    Assessment: Encounter Diagnosis  Name Primary?   Sore throat Yes     Plan: Discussed symptoms, concerns. Discussed recommendations below  Patient Instructions  Recommendations: Mono test negative today I am checking CBC blood count today Continue with salt water gargles, warm fluids Begin samples of Zyrtec daily at bedtime for 1-2 weeks in the even the throat irritation is from drainage or congestion Begin omeprazole acid reflux medication in the morning on empty stomach about 30 minutes before  breakfast for 1-2 weeks as acid reflux could be aggravating the throat No obvious infection today    Richard Berg was seen today for other.  Diagnoses and all orders for this visit:  Sore throat -     CBC with Differential/Platelet  Other orders -     omeprazole (PRILOSEC) 40 MG capsule; Take 1 capsule (40 mg total) by mouth daily.    Follow up: prn

## 2021-12-23 NOTE — Patient Instructions (Addendum)
Recommendations: Mono test negative today I am checking CBC blood count today Continue with salt water gargles, warm fluids Begin samples of Zyrtec daily at bedtime for 1-2 weeks in the even the throat irritation is from drainage or congestion Begin omeprazole acid reflux medication in the morning on empty stomach about 30 minutes before breakfast for 1-2 weeks as acid reflux could be aggravating the throat No obvious infection today

## 2021-12-23 NOTE — Addendum Note (Signed)
Addended by: Linus Salmons, Celestina Gironda D on: 12/23/2021 04:17 PM   Modules accepted: Orders

## 2021-12-24 NOTE — Progress Notes (Signed)
Results sent through MyChart

## 2022-01-16 ENCOUNTER — Other Ambulatory Visit: Payer: Self-pay | Admitting: Medical

## 2022-02-18 LAB — HM DIABETES EYE EXAM

## 2022-02-19 DIAGNOSIS — H524 Presbyopia: Secondary | ICD-10-CM | POA: Insufficient documentation

## 2022-02-19 DIAGNOSIS — H40013 Open angle with borderline findings, low risk, bilateral: Secondary | ICD-10-CM | POA: Insufficient documentation

## 2022-02-19 DIAGNOSIS — H52223 Regular astigmatism, bilateral: Secondary | ICD-10-CM | POA: Insufficient documentation

## 2022-02-19 DIAGNOSIS — H11153 Pinguecula, bilateral: Secondary | ICD-10-CM | POA: Insufficient documentation

## 2022-02-19 DIAGNOSIS — H5213 Myopia, bilateral: Secondary | ICD-10-CM | POA: Insufficient documentation

## 2022-04-26 DIAGNOSIS — M4316 Spondylolisthesis, lumbar region: Secondary | ICD-10-CM | POA: Diagnosis not present

## 2022-05-10 ENCOUNTER — Encounter: Payer: BC Managed Care – PPO | Admitting: Medical

## 2022-06-15 ENCOUNTER — Encounter: Payer: Self-pay | Admitting: Medical

## 2022-06-15 ENCOUNTER — Ambulatory Visit (INDEPENDENT_AMBULATORY_CARE_PROVIDER_SITE_OTHER): Payer: BC Managed Care – PPO | Admitting: Medical

## 2022-06-15 VITALS — BP 122/80 | HR 86 | Ht 69.0 in | Wt 136.8 lb

## 2022-06-15 DIAGNOSIS — Z125 Encounter for screening for malignant neoplasm of prostate: Secondary | ICD-10-CM

## 2022-06-15 DIAGNOSIS — Z794 Long term (current) use of insulin: Secondary | ICD-10-CM | POA: Diagnosis not present

## 2022-06-15 DIAGNOSIS — N401 Enlarged prostate with lower urinary tract symptoms: Secondary | ICD-10-CM

## 2022-06-15 DIAGNOSIS — E119 Type 2 diabetes mellitus without complications: Secondary | ICD-10-CM

## 2022-06-15 DIAGNOSIS — Z7185 Encounter for immunization safety counseling: Secondary | ICD-10-CM

## 2022-06-15 DIAGNOSIS — E782 Mixed hyperlipidemia: Secondary | ICD-10-CM | POA: Diagnosis not present

## 2022-06-15 DIAGNOSIS — I700 Atherosclerosis of aorta: Secondary | ICD-10-CM | POA: Diagnosis not present

## 2022-06-15 DIAGNOSIS — Z0000 Encounter for general adult medical examination without abnormal findings: Secondary | ICD-10-CM

## 2022-06-15 DIAGNOSIS — K649 Unspecified hemorrhoids: Secondary | ICD-10-CM

## 2022-06-15 DIAGNOSIS — Z2820 Immunization not carried out because of patient decision for unspecified reason: Secondary | ICD-10-CM

## 2022-06-15 DIAGNOSIS — H18413 Arcus senilis, bilateral: Secondary | ICD-10-CM

## 2022-06-15 DIAGNOSIS — K219 Gastro-esophageal reflux disease without esophagitis: Secondary | ICD-10-CM | POA: Insufficient documentation

## 2022-06-15 LAB — POCT URINALYSIS DIP (PROADVANTAGE DEVICE)
Bilirubin, UA: NEGATIVE
Blood, UA: NEGATIVE
Glucose, UA: NEGATIVE mg/dL
Leukocytes, UA: NEGATIVE
Nitrite, UA: NEGATIVE
Protein Ur, POC: NEGATIVE mg/dL
Specific Gravity, Urine: 1.02
Urobilinogen, Ur: 0.2
pH, UA: 6 (ref 5.0–8.0)

## 2022-06-15 MED ORDER — NOVOLOG FLEXPEN 100 UNIT/ML ~~LOC~~ SOPN: 0.0000 [IU] | PEN_INJECTOR | Freq: Three times a day (TID) | SUBCUTANEOUS | 5 refills | Status: DC

## 2022-06-15 MED ORDER — HYDROCORTISONE 2.5 % EX CREA: TOPICAL_CREAM | Freq: Two times a day (BID) | CUTANEOUS | 0 refills | Status: DC

## 2022-06-15 MED ORDER — HYDROCORTISONE ACETATE 25 MG RE SUPP: 25.0000 mg | Freq: Two times a day (BID) | RECTAL | 0 refills | Status: DC

## 2022-06-15 MED ORDER — ROSUVASTATIN CALCIUM 20 MG PO TABS: 20.0000 mg | ORAL_TABLET | Freq: Every day | ORAL | 3 refills | Status: DC

## 2022-06-15 MED ORDER — BD PEN NEEDLE NANO U/F 32G X 4 MM MISC: 1.0000 | Freq: Three times a day (TID) | 3 refills | Status: DC

## 2022-06-15 NOTE — Assessment & Plan Note (Signed)
Needs to work on compliance with statins

## 2022-06-15 NOTE — Assessment & Plan Note (Signed)
Using sliding scale, lately using 1-3 units per meals, eating once or twice daily which is typical for him for years.

## 2022-06-15 NOTE — Assessment & Plan Note (Signed)
Begin proctosol and external cream for the next few days for hemorrhoid flare up.  Refer to general surgery for consult

## 2022-06-15 NOTE — Progress Notes (Signed)
Subjective:   HPI  Richard Berg is a 57 y.o. male who presents for Chief Complaint  Patient presents with   Annual Exam    Fasting annual exam, will try to give urine on his way out. No new concerns today.     Patient Care Team: Manhattan Mccuen, Cleda Mccreedy as PCP - General (Family Medicine) Armbruster, Willaim Rayas, MD as Consulting Physician (Gastroenterology) Runell Gess, MD as Consulting Physician (Cardiology) Sees dentist Sees eye doctor  Concerns: Diabetes - uses novolog 0-3 units once or twice daily.  Doesn't always eat breakfast.  Hasn't been using Levemir given less calorie intake and not eating but once or twice daily.  Hyperlipidemia - uses Crestor on average 2-3 days per week, forgets.  Had colonoscopy few months ago but still getting some blood in stool, bright red, but no pain or itching.  Reviewed their medical, surgical, family, social, medication, and allergy history and updated chart as appropriate.  Past Medical History:  Diagnosis Date   Constipation    Diabetes mellitus    dx'd 02/15/11   Family history of premature CAD    father MI in early 67s   Hepatitis B immune 03/2014   per titer   Hyperlipidemia    Positive TB test    "took the treatment ~ 1993"    Past Surgical History:  Procedure Laterality Date   COLONOSCOPY  2011    Family History  Problem Relation Age of Onset   Cancer Mother        breast   Hypertension Father    Heart disease Father 72       died of MI   Hypertension Sister    Heart disease Brother    Arrhythmia Brother    Diabetes type II Paternal Grandmother    Colon polyps Neg Hx    Colon cancer Neg Hx    Esophageal cancer Neg Hx    Stomach cancer Neg Hx    Rectal cancer Neg Hx      Current Outpatient Medications:    hydrocortisone (ANUSOL-HC) 25 MG suppository, Place 1 suppository (25 mg total) rectally 2 (two) times daily., Disp: 12 suppository, Rfl: 0   hydrocortisone 2.5 % cream, Apply topically 2 (two) times  daily., Disp: 30 g, Rfl: 0   insulin aspart (NOVOLOG FLEXPEN) 100 UNIT/ML FlexPen, Inject 0-3 Units into the skin 3 (three) times daily with meals., Disp: 15 mL, Rfl: 5   Insulin Pen Needle (BD PEN NEEDLE NANO U/F) 32G X 4 MM MISC, 1 each by Does not apply route 3 (three) times daily., Disp: 100 each, Rfl: 3   rosuvastatin (CRESTOR) 20 MG tablet, Take 1 tablet (20 mg total) by mouth daily., Disp: 90 tablet, Rfl: 3  Allergies  Allergen Reactions   Lisinopril Swelling    Lip swelling   Penicillins Hives    Review of Systems  Constitutional:  Negative for chills, fever, malaise/fatigue and weight loss.  HENT:  Negative for congestion, ear pain, hearing loss, sore throat and tinnitus.   Eyes:  Negative for blurred vision, pain and redness.  Respiratory:  Negative for cough, hemoptysis and shortness of breath.   Cardiovascular:  Negative for chest pain, palpitations, orthopnea, claudication and leg swelling.  Gastrointestinal:  Negative for abdominal pain, blood in stool, constipation, diarrhea, nausea and vomiting.  Genitourinary:  Negative for dysuria, flank pain, frequency, hematuria and urgency.  Musculoskeletal:  Negative for falls, joint pain and myalgias.  Skin:  Negative for itching and  rash.  Neurological:  Negative for dizziness, tingling, speech change, weakness and headaches.  Endo/Heme/Allergies:  Negative for polydipsia. Does not bruise/bleed easily.  Psychiatric/Behavioral:  Negative for depression and memory loss. The patient is not nervous/anxious and does not have insomnia.         06/15/2022    1:35 PM 10/02/2021   11:03 AM 05/06/2021    9:47 AM 11/06/2020    8:19 AM 05/05/2020    2:11 PM  Depression screen PHQ 2/9  Decreased Interest 0 0 0 0 0  Down, Depressed, Hopeless 0 0 0 0 0  PHQ - 2 Score 0 0 0 0 0        Objective:  BP 122/80   Pulse 86   Ht 5\' 9"  (1.753 m)   Wt 136 lb 12.8 oz (62.1 kg)   SpO2 97%   BMI 20.20 kg/m   Physical Exam Vitals and nursing  note reviewed.  Constitutional:      General: He is not in acute distress.    Appearance: Normal appearance. He is not ill-appearing.  HENT:     Head: Normocephalic and atraumatic.     Right Ear: External ear normal.     Left Ear: External ear normal.     Nose: Nose normal.     Mouth/Throat:     Mouth: Mucous membranes are moist.     Pharynx: Oropharynx is clear.  Eyes:     Extraocular Movements: Extraocular movements intact.     Conjunctiva/sclera: Conjunctivae normal.     Pupils: Pupils are equal, round, and reactive to light.  Neck:     Vascular: No carotid bruit.  Cardiovascular:     Rate and Rhythm: Normal rate and regular rhythm.     Pulses: Normal pulses.     Heart sounds: Normal heart sounds.  Pulmonary:     Effort: Pulmonary effort is normal.     Breath sounds: Normal breath sounds.  Abdominal:     General: Bowel sounds are normal. There is no distension.     Palpations: Abdomen is soft. There is no mass.     Tenderness: There is no abdominal tenderness.     Hernia: No hernia is present.  Genitourinary:    Penis: Normal.      Testes: Normal.     Comments: Prostate moderately enlarged, no  nodules, 2 moderate sized external hemorrhoids, nontender and not bleeding Musculoskeletal:        General: No swelling, tenderness or deformity. Normal range of motion.     Cervical back: Normal range of motion and neck supple. No tenderness.     Right lower leg: No edema.     Left lower leg: No edema.  Lymphadenopathy:     Cervical: No cervical adenopathy.  Skin:    General: Skin is warm and dry.     Capillary Refill: Capillary refill takes less than 2 seconds.  Neurological:     General: No focal deficit present.     Mental Status: He is alert and oriented to person, place, and time. Mental status is at baseline.     Cranial Nerves: No cranial nerve deficit.     Sensory: No sensory deficit.     Motor: No weakness.     Gait: Gait normal.     Deep Tendon Reflexes:  Reflexes normal.  Psychiatric:        Mood and Affect: Mood normal.        Behavior: Behavior normal.  Judgment: Judgment normal.    Diabetic Foot Exam - Simple   Simple Foot Form Diabetic Foot exam was performed with the following findings: Yes 06/15/2022  2:14 PM  Visual Inspection No deformities, no ulcerations, no other skin breakdown bilaterally: Yes Sensation Testing Intact to touch and monofilament testing bilaterally: Yes Pulse Check Posterior Tibialis and Dorsalis pulse intact bilaterally: Yes Comments      Assessment and Plan :   Encounter Diagnoses  Name Primary?   Routine general medical examination at a health care facility Yes   Insulin dependent type 2 diabetes mellitus (HCC)    Aortic atherosclerosis (HCC)    Mixed hyperlipidemia    Gastroesophageal reflux disease without esophagitis    Hemorrhoids, unspecified hemorrhoid type    Screening for prostate cancer    Vaccine counseling    Arcus senilis of both corneas    Vaccine refused by patient    Benign prostatic hyperplasia with lower urinary tract symptoms, symptom details unspecified     This visit was a preventative care visit, also known as wellness visit or routine physical.   Topics typically include healthy lifestyle, diet, exercise, preventative care, vaccinations, sick and well care, proper use of emergency dept and after hours care, as well as other concerns.     Recommendations: Continue to return yearly for your annual wellness and preventative care visits.  This gives [place] a chance to discuss healthy lifestyle, exercise, vaccinations, review your chart record, and perform screenings where appropriate.  I recommend you see your eye doctor yearly for routine vision care.  I recommend you see your dentist yearly for routine dental care including hygiene visits twice yearly.   Vaccination recommendations were reviewed Immunization History  Administered Date(s) Administered    PFIZER(Purple Top)SARS-COV-2 Vaccination 03/09/2019, 04/10/2019, 12/18/2019   Td 05/05/2020   Tdap 08/15/2007   You declined vaccines today  You are due for Shingles and Pneumococcal vaccines   Screening for cancer: Colon cancer screening: Reviewed 09/2021 colonoscopy report  We discussed PSA, prostate exam, and prostate cancer screening risks/benefits.     Skin cancer screening: Check your skin regularly for new changes, growing lesions, or other lesions of concern Come in for evaluation if you have skin lesions of concern.  Lung cancer screening: If you have a greater than 20 pack year history of tobacco use, then you may qualify for lung cancer screening with a chest CT scan.   Please call your insurance company to inquire about coverage for this test.  We currently don't have screenings for other cancers besides breast, cervical, colon, and lung cancers.  If you have a strong family history of cancer or have other cancer screening concerns, please let me know.    Bone health: Get at least 150 minutes of aerobic exercise weekly Get weight bearing exercise at least once weekly Bone density test:  A bone density test is an imaging test that uses a type of X-ray to measure the amount of calcium and other minerals in your bones. The test may be used to diagnose or screen you for a condition that causes weak or thin bones (osteoporosis), predict your risk for a broken bone (fracture), or determine how well your osteoporosis treatment is working. The bone density test is recommended for females 65 and older, or females or males <65 if certain risk factors such as thyroid disease, long term use of steroids such as for asthma or rheumatological issues, vitamin D deficiency, estrogen deficiency, family history of osteoporosis, self or  family history of fragility fracture in first degree relative.    Heart health: Get at least 150 minutes of aerobic exercise weekly Limit alcohol It is  important to maintain a healthy blood pressure and healthy cholesterol numbers  Heart disease screening: Screening for heart disease includes screening for blood pressure, fasting lipids, glucose/diabetes screening, BMI height to weight ratio, reviewed of smoking status, physical activity, and diet.    Goals include blood pressure 120/80 or less, maintaining a healthy lipid/cholesterol profile, preventing diabetes or keeping diabetes numbers under good control, not smoking or using tobacco products, exercising most days per week or at least 150 minutes per week of exercise, and eating healthy variety of fruits and vegetables, healthy oils, and avoiding unhealthy food choices like fried food, fast food, high sugar and high cholesterol foods.    Other tests may possibly include EKG test, CT coronary calcium score, echocardiogram, exercise treadmill stress test.   Coronary calcium score of 0 July 2022.    Echocardiogram July 2022 with 60 to 65% ejection fraction, no regional wall motion abnormalities, the only abnormal finding was mild dilation of the left atrium and mild aortic valve sclerosis.    Medical care options: I recommend you continue to seek care here first for routine care.  We try really hard to have available appointments Monday through Friday daytime hours for sick visits, acute visits, and physicals.  Urgent care should be used for after hours and weekends for significant issues that cannot wait till the next day.  The emergency department should be used for significant potentially life-threatening emergencies.  The emergency department is expensive, can often have long wait times for less significant concerns, so try to utilize primary care, urgent care, or telemedicine when possible to avoid unnecessary trips to the emergency department.  Virtual visits and telemedicine have been introduced since the pandemic started in 2020, and can be convenient ways to receive medical care.  We  offer virtual appointments as well to assist you in a variety of options to seek medical care.   Advanced Directives: I recommend you consider completing a Health Care Power of Attorney and Living Will.   These documents respect your wishes and help alleviate burdens on your loved ones if you were to become terminally ill or be in a position to need those documents enforced.    You can complete Advanced Directives yourself, have them notarized, then have copies made for our office, for you and for anybody you feel should have them in safe keeping.  Or, you can have an attorney prepare these documents.   If you haven't updated your Last Will and Testament in a while, it may be worthwhile having an attorney prepare these documents together and save on some costs.       Separate significant issues discussed: Problem List Items Addressed This Visit     Hemorrhoids    Begin proctosol and external cream for the next few days for hemorrhoid flare up.  Refer to general surgery for consult      Relevant Medications   rosuvastatin (CRESTOR) 20 MG tablet   Other Relevant Orders   Ambulatory referral to General Surgery   Vaccine counseling   Benign prostatic hyperplasia with lower urinary tract symptoms   Routine general medical examination at a health care facility - Primary   Relevant Orders   CBC with Differential/Platelet   CMP14+EGFR   Lipid panel   PSA, total and free   TSH   Hemoglobin A1c  Microalbumin/Creatinine Ratio, Urine   Screening for prostate cancer   Hyperlipidemia    Needs to work on compliance with statins      Relevant Medications   rosuvastatin (CRESTOR) 20 MG tablet   Other Relevant Orders   Lipid panel   Aortic atherosclerosis (HCC)    Needs to work on compliance with statins      Relevant Medications   rosuvastatin (CRESTOR) 20 MG tablet   Insulin dependent type 2 diabetes mellitus (HCC)    Using sliding scale, lately using 1-3 units per meals, eating  once or twice daily which is typical for him for years.        Relevant Medications   rosuvastatin (CRESTOR) 20 MG tablet   insulin aspart (NOVOLOG FLEXPEN) 100 UNIT/ML FlexPen   Other Relevant Orders   Hemoglobin A1c   Microalbumin/Creatinine Ratio, Urine   Vaccine refused by patient   Arcus senilis of both corneas   Gastroesophageal reflux disease without esophagitis    Follow-up pending labs, yearly for physical

## 2022-06-15 NOTE — Addendum Note (Signed)
Addended by: Debbrah Alar F on: 06/15/2022 04:30 PM   Modules accepted: Orders

## 2022-06-16 LAB — CBC WITH DIFFERENTIAL/PLATELET
Basophils Absolute: 0 10*3/uL (ref 0.0–0.2)
Basos: 1 %
EOS (ABSOLUTE): 0 10*3/uL (ref 0.0–0.4)
Eos: 1 %
Hematocrit: 36.2 % — ABNORMAL LOW (ref 37.5–51.0)
Hemoglobin: 11.3 g/dL — ABNORMAL LOW (ref 13.0–17.7)
Immature Grans (Abs): 0 10*3/uL (ref 0.0–0.1)
Immature Granulocytes: 0 %
Lymphocytes Absolute: 1.7 10*3/uL (ref 0.7–3.1)
Lymphs: 45 %
MCH: 26.7 pg (ref 26.6–33.0)
MCHC: 31.2 g/dL — ABNORMAL LOW (ref 31.5–35.7)
MCV: 85 fL (ref 79–97)
Monocytes Absolute: 0.6 10*3/uL (ref 0.1–0.9)
Monocytes: 15 %
Neutrophils Absolute: 1.4 10*3/uL (ref 1.4–7.0)
Neutrophils: 38 %
Platelets: 369 10*3/uL (ref 150–450)
RBC: 4.24 x10E6/uL (ref 4.14–5.80)
RDW: 15.5 % — ABNORMAL HIGH (ref 11.6–15.4)
WBC: 3.8 10*3/uL (ref 3.4–10.8)

## 2022-06-16 LAB — LIPID PANEL
Chol/HDL Ratio: 1.8 ratio (ref 0.0–5.0)
Cholesterol, Total: 238 mg/dL — ABNORMAL HIGH (ref 100–199)
HDL: 129 mg/dL (ref >39)
LDL Chol Calc (NIH): 99 mg/dL (ref 0–99)
Triglycerides: 61 mg/dL (ref 0–149)
VLDL Cholesterol Cal: 10 mg/dL (ref 5–40)

## 2022-06-16 LAB — CMP14+EGFR
ALT: 26 IU/L (ref 0–44)
AST: 27 IU/L (ref 0–40)
Albumin/Globulin Ratio: 1.7
Albumin: 4.7 g/dL (ref 3.8–4.9)
Alkaline Phosphatase: 83 IU/L (ref 44–121)
BUN/Creatinine Ratio: 14 (ref 9–20)
BUN: 12 mg/dL (ref 6–24)
Bilirubin Total: 0.4 mg/dL (ref 0.0–1.2)
CO2: 24 mmol/L (ref 20–29)
Calcium: 9.9 mg/dL (ref 8.7–10.2)
Chloride: 100 mmol/L (ref 96–106)
Creatinine, Ser: 0.85 mg/dL (ref 0.76–1.27)
Globulin, Total: 2.7 g/dL (ref 1.5–4.5)
Glucose: 78 mg/dL (ref 70–99)
Potassium: 4.6 mmol/L (ref 3.5–5.2)
Sodium: 139 mmol/L (ref 134–144)
Total Protein: 7.4 g/dL (ref 6.0–8.5)
eGFR: 102 mL/min/{1.73_m2} (ref >59)

## 2022-06-16 LAB — PSA, TOTAL AND FREE
PSA, Free Pct: 27.3 %
PSA, Free: 0.3 ng/mL
Prostate Specific Ag, Serum: 1.1 ng/mL (ref 0.0–4.0)

## 2022-06-16 LAB — HEMOGLOBIN A1C
Est. average glucose Bld gHb Est-mCnc: 137 mg/dL
Hgb A1c MFr Bld: 6.4 % — ABNORMAL HIGH (ref 4.8–5.6)

## 2022-06-16 LAB — TSH: TSH: 1.82 u[IU]/mL (ref 0.450–4.500)

## 2022-06-16 LAB — MICROALBUMIN / CREATININE URINE RATIO
Creatinine, Urine: 121.4 mg/dL
Microalb/Creat Ratio: 6 mg/g creat (ref 0–29)
Microalbumin, Urine: 7 ug/mL

## 2022-06-16 NOTE — Progress Notes (Signed)
Microalbumin kidney marker normal.  Let me know about the other questions that had this for his changing his diabetes medications.  See prior message

## 2022-06-16 NOTE — Progress Notes (Signed)
Results sent through MyChart

## 2022-06-17 ENCOUNTER — Other Ambulatory Visit: Payer: Self-pay | Admitting: Medical

## 2022-06-17 MED ORDER — GLIPIZIDE 5 MG PO TABS: 5.0000 mg | ORAL_TABLET | Freq: Two times a day (BID) | ORAL | 2 refills | Status: DC

## 2023-04-19 DIAGNOSIS — H40013 Open angle with borderline findings, low risk, bilateral: Secondary | ICD-10-CM | POA: Diagnosis not present

## 2023-04-19 DIAGNOSIS — H2513 Age-related nuclear cataract, bilateral: Secondary | ICD-10-CM | POA: Diagnosis not present

## 2023-04-19 DIAGNOSIS — H35033 Hypertensive retinopathy, bilateral: Secondary | ICD-10-CM | POA: Diagnosis not present

## 2023-04-19 LAB — HM DIABETES EYE EXAM

## 2023-05-01 IMAGING — CT CT CARDIAC CORONARY ARTERY CALCIUM SCORE
3 series · 14 of 20 positions shown, 15 images · non-contrast
Comparison: Chest radiograph 11/04/2010.

Addendum:
CLINICAL DATA: Cardiovascular Disease Risk stratification

EXAM:
Coronary Calcium Score
TECHNIQUE: A gated, non-contrast computed tomography scan of the heart was
performed using 3mm slice thickness. Axial images were analyzed on a
dedicated workstation. Calcium scoring of the coronary arteries was
performed using the Agatston method.

[Series 2: casc 3.0 bv41 2 bestsyst 37 % · axial · 0.46mm/px · z∈[-242,-161]mm · 4 of 45 slices shown, 5 images]
[im 9/45  vessel]
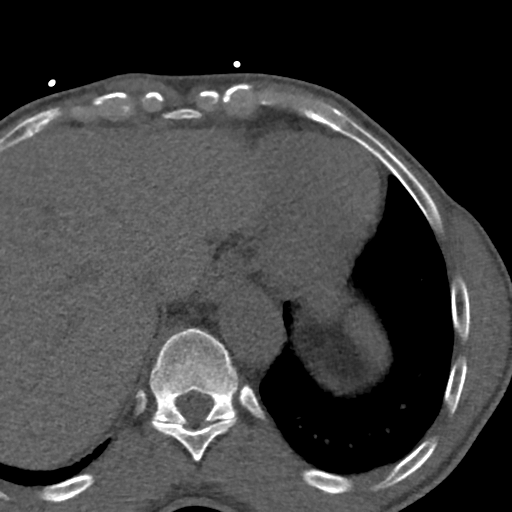
[im 9/45  lung]
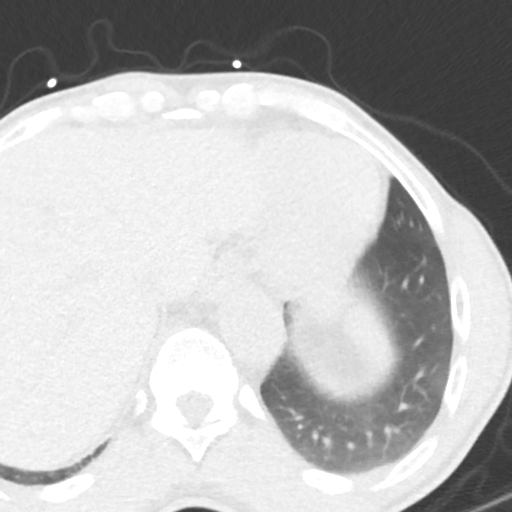
[im 18/45  vessel]
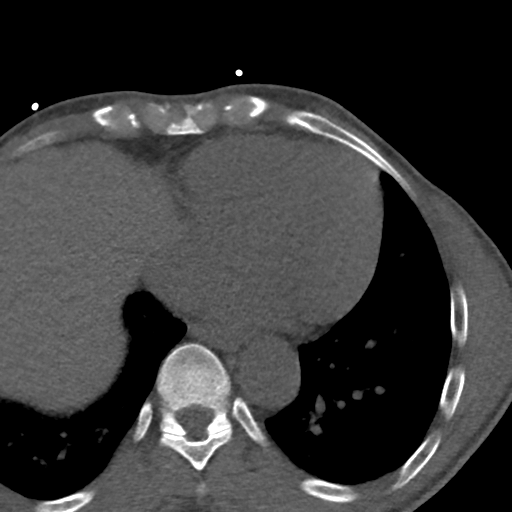
[im 27/45  vessel]
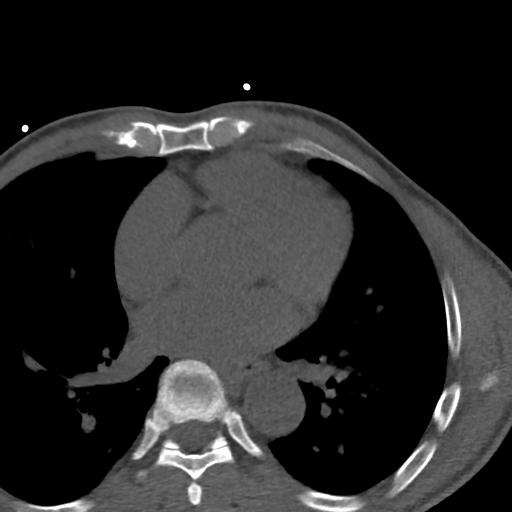
[im 36/45  vessel]
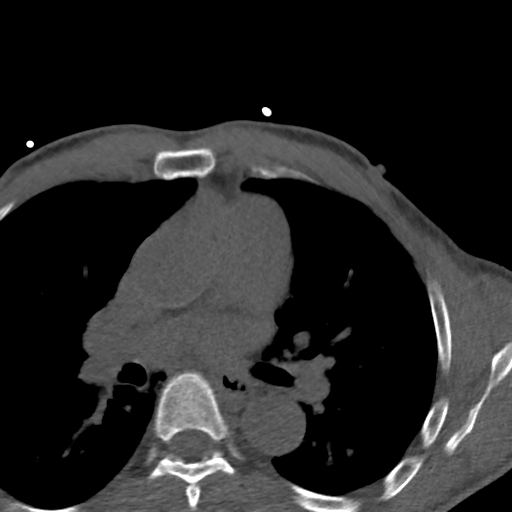

[Series 3: lung 37 % · axial · 0.71mm/px · z∈[-245,-158]mm · 5 of 45 slices shown]
[im 8/45  lung]
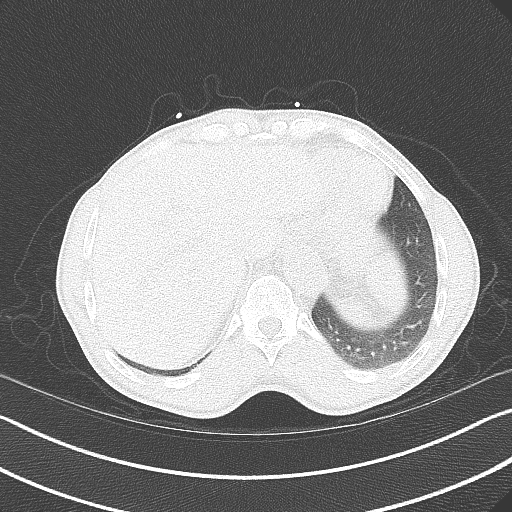
[im 15/45  lung]
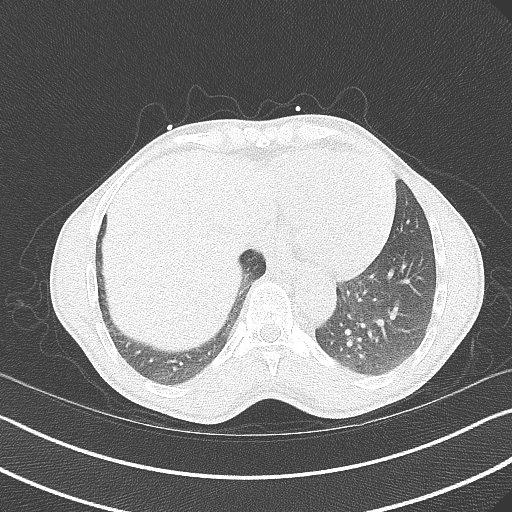
[im 23/45  lung]
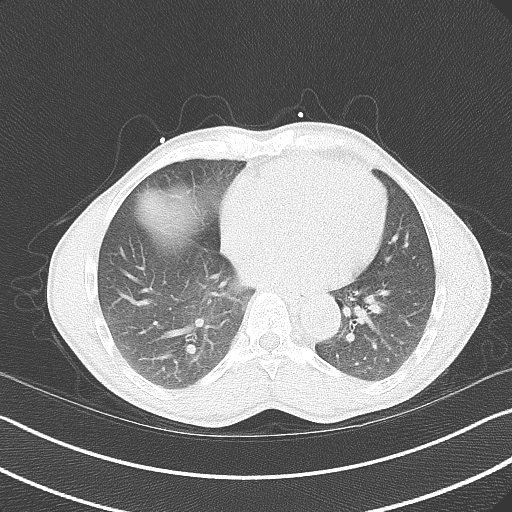
[im 30/45  lung]
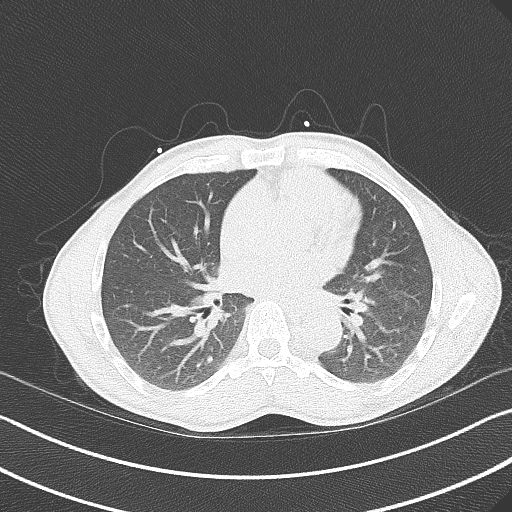
[im 37/45  lung]
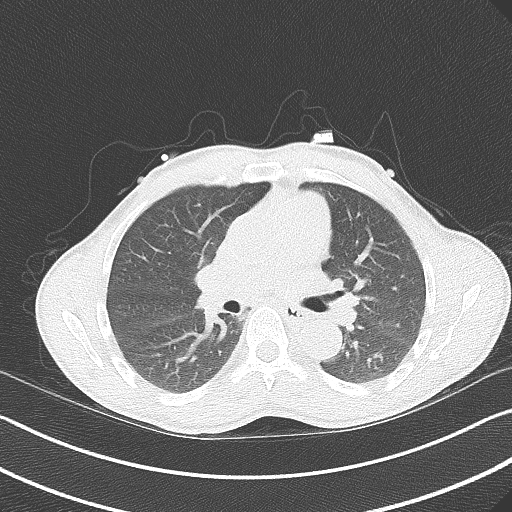

[Series 4: lung st 37 % · axial · 0.71mm/px · z∈[-245,-158]mm · 5 of 45 slices shown]
[im 8/45  lung]
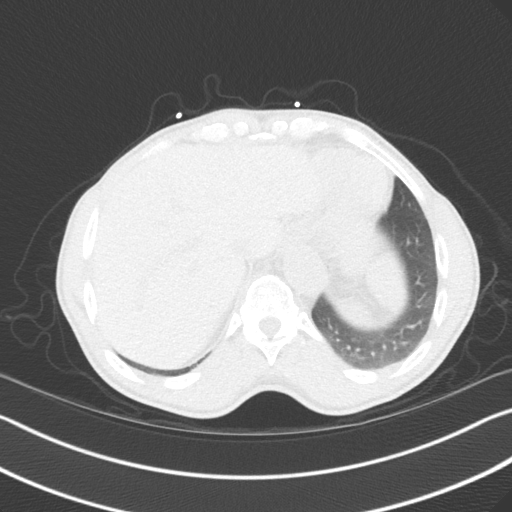
[im 15/45  lung]
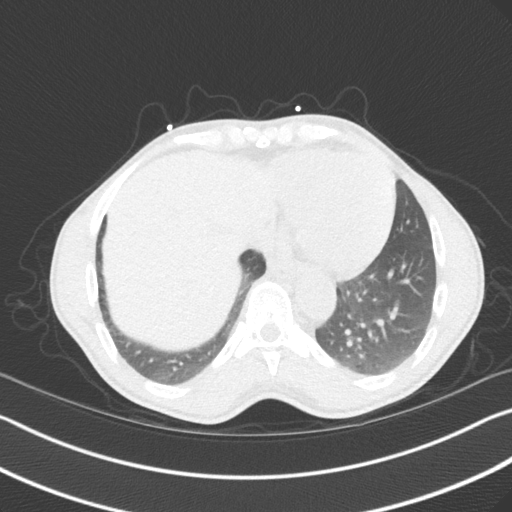
[im 23/45  lung]
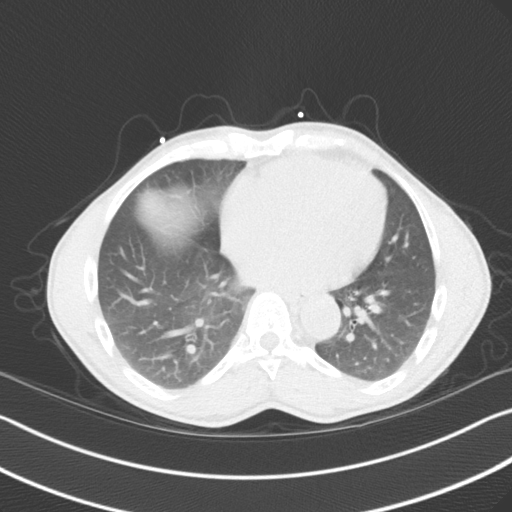
[im 30/45  lung]
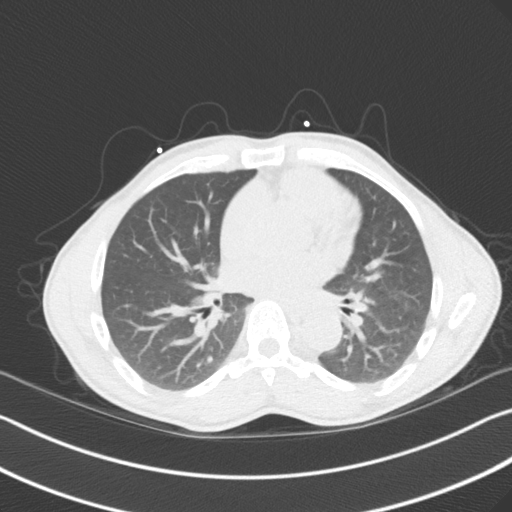
[im 37/45  lung]
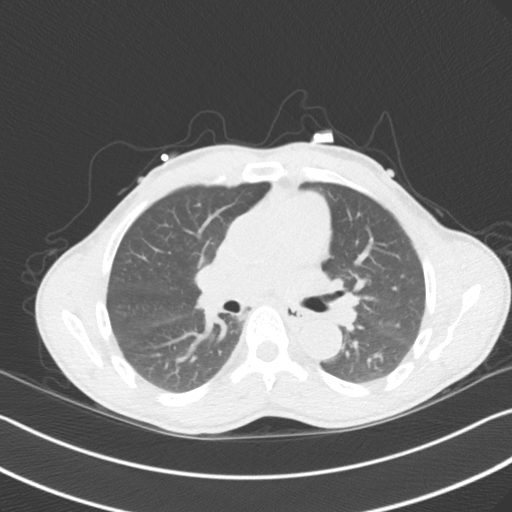

[14 of 20 positions shown; findings below may reference images not displayed]

FINDINGS: Coronary arteries: Normal origins.

Coronary Calcium Score:

Left main: 0

Left anterior descending artery: 0

Left circumflex artery: 0

Right coronary artery: 0

Total: 0

Percentile: 0

Pericardium: Normal.

Ascending Aorta: Normal caliber.

Non-cardiac: See separate report from [REDACTED].
IMPRESSION: Coronary calcium score of 0. This is a low risk study.



If CAC=0, it is reasonable to withhold statin therapy and reassess
in 5 to 10 years, as long as higher risk conditions are absent
(diabetes mellitus, family history of premature CHD in first degree
relatives (males <55 years; females <65 years), cigarette smoking,
or LDL >=190 mg/dL).

If CAC is 1 to 99, it is reasonable to initiate statin therapy for
patients >=55 years of age.

If CAC is >=100 or >=75th percentile, it is reasonable to initiate
statin therapy at any age.

Cardiology referral should be considered for patients with CAC
scores >=400 or >=75th percentile.

*0014 AHA/ACC/AACVPR/AAPA/ABC/NASIR/ALESSANDRITO/ROLO/Yupa/LUISANA/JUMPER/KLEVER
Guideline on the Management of Blood Cholesterol: A Report of the
American College of Cardiology/American Heart Association Task Force
on Clinical Practice Guidelines. J Am Coll Cardiol.
0866;73(24):7965-7809.

EXAM:
OVER-READ INTERPRETATION  CT CHEST

The following report is an over-read performed by radiologist Dr.
Bousso Meacci [REDACTED] on 08/01/2020. This over-read
does not include interpretation of cardiac or coronary anatomy or
pathology. The calcium score interpretation by the cardiologist is
attached.
FINDINGS: Vascular: Normal aortic caliber.

Mediastinum/Nodes: No imaged thoracic adenopathy.

Lungs/Pleura: No pleural fluid.  Clear imaged lungs.

Upper Abdomen: Normal imaged portions of the liver, spleen, stomach,
adrenal glands, kidneys.

Musculoskeletal: No acute osseous abnormality.
IMPRESSION: No acute findings in the imaged extracardiac chest.

*** End of Addendum ***
FINDINGS: Coronary arteries: Normal origins.

Coronary Calcium Score:

Left main: 0

Left anterior descending artery: 0

Left circumflex artery: 0

Right coronary artery: 0

Total: 0

Percentile: 0

Pericardium: Normal.

Ascending Aorta: Normal caliber.

Non-cardiac: See separate report from [REDACTED].
IMPRESSION: Coronary calcium score of 0. This is a low risk study.



If CAC=0, it is reasonable to withhold statin therapy and reassess
in 5 to 10 years, as long as higher risk conditions are absent
(diabetes mellitus, family history of premature CHD in first degree
relatives (males <55 years; females <65 years), cigarette smoking,
or LDL >=190 mg/dL).

If CAC is 1 to 99, it is reasonable to initiate statin therapy for
patients >=55 years of age.

If CAC is >=100 or >=75th percentile, it is reasonable to initiate
statin therapy at any age.

Cardiology referral should be considered for patients with CAC
scores >=400 or >=75th percentile.

*0014 AHA/ACC/AACVPR/AAPA/ABC/NASIR/ALESSANDRITO/ROLO/Yupa/LUISANA/JUMPER/KLEVER
Guideline on the Management of Blood Cholesterol: A Report of the
American College of Cardiology/American Heart Association Task Force
on Clinical Practice Guidelines. J Am Coll Cardiol.
0866;73(24):7965-7809.

## 2023-06-20 ENCOUNTER — Encounter: Payer: BC Managed Care – PPO | Admitting: Medical

## 2023-07-11 DIAGNOSIS — H40013 Open angle with borderline findings, low risk, bilateral: Secondary | ICD-10-CM | POA: Diagnosis not present

## 2023-07-11 DIAGNOSIS — H2513 Age-related nuclear cataract, bilateral: Secondary | ICD-10-CM | POA: Diagnosis not present

## 2023-07-12 ENCOUNTER — Ambulatory Visit: Admitting: Medical

## 2023-07-12 VITALS — BP 130/70 | HR 94 | Ht 68.5 in | Wt 136.2 lb

## 2023-07-12 DIAGNOSIS — Z1389 Encounter for screening for other disorder: Secondary | ICD-10-CM

## 2023-07-12 DIAGNOSIS — I700 Atherosclerosis of aorta: Secondary | ICD-10-CM

## 2023-07-12 DIAGNOSIS — Z7185 Encounter for immunization safety counseling: Secondary | ICD-10-CM | POA: Diagnosis not present

## 2023-07-12 DIAGNOSIS — E782 Mixed hyperlipidemia: Secondary | ICD-10-CM | POA: Diagnosis not present

## 2023-07-12 DIAGNOSIS — K649 Unspecified hemorrhoids: Secondary | ICD-10-CM | POA: Diagnosis not present

## 2023-07-12 DIAGNOSIS — Z125 Encounter for screening for malignant neoplasm of prostate: Secondary | ICD-10-CM | POA: Diagnosis not present

## 2023-07-12 DIAGNOSIS — Z0000 Encounter for general adult medical examination without abnormal findings: Secondary | ICD-10-CM

## 2023-07-12 DIAGNOSIS — Z2820 Immunization not carried out because of patient decision for unspecified reason: Secondary | ICD-10-CM

## 2023-07-12 DIAGNOSIS — N401 Enlarged prostate with lower urinary tract symptoms: Secondary | ICD-10-CM | POA: Diagnosis not present

## 2023-07-12 DIAGNOSIS — E1165 Type 2 diabetes mellitus with hyperglycemia: Secondary | ICD-10-CM

## 2023-07-12 MED ORDER — HYDROCORTISONE ACETATE 25 MG RE SUPP
25.0000 mg | Freq: Two times a day (BID) | RECTAL | 1 refills | Status: DC
Start: 2023-07-12 — End: 2023-10-19

## 2023-07-12 MED ORDER — HYDROCORTISONE 2.5 % EX CREA: TOPICAL_CREAM | Freq: Two times a day (BID) | CUTANEOUS | 1 refills | Status: DC

## 2023-07-12 NOTE — Progress Notes (Signed)
 Subjective:   HPI  Richard Berg is a 58 y.o. male who presents for Chief Complaint  Patient presents with   Annual Exam    Nonfasting cpe, no concerns, eye exam done yesterday at [facility] eye care. Declines vaccines today    Patient Care Team: Paw Karstens, Alm RAMAN, PA-C as PCP - General (Family Medicine) Armbruster, Elspeth SQUIBB, MD as Consulting Physician (Gastroenterology) [facility] Sees dentist Sees eye doctor   Concerns: Diabetes - prior on insulin , but in past year added or changed to glipizide , but doesn't have to use it often.  Checks glucose 2-3 times per week.  Sees 120s-130s.    Hyperlipidemia - uses Crestor  regularly  Having intermittent hemorrhoid issues, mostly internal, irritation, fullness.    Reviewed their medical, surgical, family, social, medication, and allergy history and updated chart as appropriate.  Past Medical History:  Diagnosis Date   Constipation    Diabetes mellitus    dx'd 02/15/11   Family history of premature CAD    father MI in early 84s   Hepatitis B immune 03/2014   per titer   Hyperlipidemia    Positive TB test    took the treatment ~ 1993    Past Surgical History:  Procedure Laterality Date   COLONOSCOPY  2011    Family History  Problem Relation Age of Onset   Cancer Mother        breast   Hypertension Father    Heart disease Father 61       died of MI   Hypertension Sister    Heart disease Brother    Arrhythmia Brother    Diabetes type II Paternal Grandmother    Colon polyps Neg Hx    Colon cancer Neg Hx    Esophageal cancer Neg Hx    Stomach cancer Neg Hx    Rectal cancer Neg Hx      Current Outpatient Medications:    hydrocortisone  (ANUSOL -HC) 25 MG suppository, Place 1 suppository (25 mg total) rectally 2 (two) times daily., Disp: 12 suppository, Rfl: 1   hydrocortisone  2.5 % cream, Apply topically 2 (two) times daily., Disp: 30 g, Rfl: 1   RESTASIS 0.05 % ophthalmic emulsion, Place 1 drop  into both eyes 2 (two) times daily., Disp: , Rfl:    rosuvastatin  (CRESTOR ) 20 MG tablet, Take 1 tablet (20 mg total) by mouth daily., Disp: 90 tablet, Rfl: 3   glipiZIDE  (GLUCOTROL ) 5 MG tablet, Take 1 tablet (5 mg total) by mouth 2 (two) times daily., Disp: 60 tablet, Rfl: 2  Allergies  Allergen Reactions   Lisinopril  Swelling    Lip swelling   Penicillins Hives    Review of Systems  Constitutional:  Negative for chills, fever, malaise/fatigue and weight loss.  HENT:  Negative for congestion, ear pain, hearing loss, sore throat and tinnitus.   Eyes:  Negative for blurred vision, pain and redness.  Respiratory:  Negative for cough, hemoptysis and shortness of breath.   Cardiovascular:  Negative for chest pain, palpitations, orthopnea, claudication and leg swelling.  Gastrointestinal:  Positive for blood in stool. Negative for abdominal pain, constipation, diarrhea, nausea and vomiting.  Genitourinary:  Negative for dysuria, flank pain, frequency, hematuria and urgency.  Musculoskeletal:  Negative for falls, joint pain and myalgias.  Skin:  Negative for itching and rash.  Neurological:  Negative for dizziness, tingling, speech change, weakness and headaches.  Endo/Heme/Allergies:  Negative for polydipsia. Does not bruise/bleed easily.  Psychiatric/Behavioral:  Negative for depression  and memory loss. The patient is not nervous/anxious and does not have insomnia.         07/12/2023    1:55 PM 06/15/2022    1:35 PM 10/02/2021   11:03 AM 05/06/2021    9:47 AM 11/06/2020    8:19 AM  Depression screen PHQ 2/9  Decreased Interest 0 0 0 0 0  Down, Depressed, Hopeless 0 0 0 0 0  PHQ - 2 Score 0 0 0 0 0        Objective:  BP 130/70   Pulse 94   Ht 5' 8.5 (1.74 m)   Wt 136 lb 3.2 oz (61.8 kg)   SpO2 97%   BMI 20.41 kg/m   Wt Readings from Last 3 Encounters:  07/12/23 136 lb 3.2 oz (61.8 kg)  06/15/22 136 lb 12.8 oz (62.1 kg)  12/23/21 135 lb 12.8 oz (61.6 kg)   BP Readings  from Last 3 Encounters:  07/12/23 130/70  06/15/22 122/80  12/23/21 138/88     Physical Exam Vitals and nursing note reviewed.  Constitutional:      General: He is not in acute distress.    Appearance: Normal appearance. He is not ill-appearing.  HENT:     Head: Normocephalic and atraumatic.     Right Ear: External ear normal.     Left Ear: External ear normal.     Nose: Nose normal.     Mouth/Throat:     Mouth: Mucous membranes are moist.     Pharynx: Oropharynx is clear.  Eyes:     Extraocular Movements: Extraocular movements intact.     Conjunctiva/sclera: Conjunctivae normal.     Pupils: Pupils are equal, round, and reactive to light.  Neck:     Vascular: No carotid bruit.  Cardiovascular:     Rate and Rhythm: Normal rate and regular rhythm.     Pulses: Normal pulses.     Heart sounds: Normal heart sounds.  Pulmonary:     Effort: Pulmonary effort is normal.     Breath sounds: Normal breath sounds.  Abdominal:     General: Bowel sounds are normal. There is no distension.     Palpations: Abdomen is soft. There is no mass.     Tenderness: There is no abdominal tenderness.     Hernia: No hernia is present.  Genitourinary:    Comments: deferred Musculoskeletal:        General: No swelling, tenderness or deformity. Normal range of motion.     Cervical back: Normal range of motion and neck supple. No tenderness.     Right lower leg: No edema.     Left lower leg: No edema.  Lymphadenopathy:     Cervical: No cervical adenopathy.  Skin:    General: Skin is warm and dry.     Capillary Refill: Capillary refill takes less than 2 seconds.  Neurological:     General: No focal deficit present.     Mental Status: He is alert and oriented to person, place, and time. Mental status is at baseline.     Cranial Nerves: No cranial nerve deficit.     Sensory: No sensory deficit.     Motor: No weakness.     Gait: Gait normal.     Deep Tendon Reflexes: Reflexes normal.   Psychiatric:        Mood and Affect: Mood normal.        Behavior: Behavior normal.        Judgment: Judgment  normal.     Diabetic Foot Exam - Simple   Simple Foot Form Diabetic Foot exam was performed with the following findings: Yes 07/12/2023  2:26 PM  Visual Inspection See comments: Yes Sensation Testing See comments: Yes Pulse Check See comments: Yes Comments      Assessment and Plan :   Encounter Diagnoses  Name Primary?   Routine general medical examination at a health care facility Yes   Screening for prostate cancer    Vaccine counseling    Vaccine refused by patient    Mixed hyperlipidemia    Hemorrhoids, unspecified hemorrhoid type    Aortic atherosclerosis (HCC)    Benign prostatic hyperplasia with lower urinary tract symptoms, symptom details unspecified    Screening for hematuria or proteinuria    Type 2 diabetes mellitus with hyperglycemia, unspecified whether long term insulin  use (HCC)      This visit was a preventative care visit, also known as wellness visit or routine physical.   Topics typically include healthy lifestyle, diet, exercise, preventative care, vaccinations, sick and well care, proper use of emergency dept and after hours care, as well as other concerns.     Recommendations: Continue to return yearly for your annual wellness and preventative care visits.  This gives us  a chance to discuss healthy lifestyle, exercise, vaccinations, review your chart record, and perform screenings where appropriate.  I recommend you see your eye doctor yearly for routine vision care.  I recommend you see your dentist yearly for routine dental care including hygiene visits twice yearly.   Vaccination recommendations were reviewed Immunization History  Administered Date(s) Administered   PFIZER(Purple Top)SARS-COV-2 Vaccination 03/09/2019, 04/10/2019, 12/18/2019   Td 05/05/2020   Tdap 08/15/2007   You declined vaccines today  You are due for  Shingles and Pneumococcal vaccines   Screening for cancer: Colon cancer screening: Reviewed 09/2021 colonoscopy report.  Due repeat 2026.  We discussed PSA, prostate exam, and prostate cancer screening risks/benefits.     Skin cancer screening: Check your skin regularly for new changes, growing lesions, or other lesions of concern Come in for evaluation if you have skin lesions of concern.  Lung cancer screening: If you have a greater than 20 pack year history of tobacco use, then you may qualify for lung cancer screening with a chest CT scan.   Please call your insurance company to inquire about coverage for this test.  We currently don't have screenings for other cancers besides breast, cervical, colon, and lung cancers.  If you have a strong family history of cancer or have other cancer screening concerns, please let me know.    Bone health: Get at least 150 minutes of aerobic exercise weekly Get weight bearing exercise at least once weekly Bone density test:  A bone density test is an imaging test that uses a type of X-ray to measure the amount of calcium  and other minerals in your bones. The test may be used to diagnose or screen you for a condition that causes weak or thin bones (osteoporosis), predict your risk for a broken bone (fracture), or determine how well your osteoporosis treatment is working. The bone density test is recommended for females 65 and older, or females or males <65 if certain risk factors such as thyroid  disease, long term use of steroids such as for asthma or rheumatological issues, vitamin D deficiency, estrogen deficiency, family history of osteoporosis, self or family history of fragility fracture in first degree relative.    Heart health: Get  at least 150 minutes of aerobic exercise weekly Limit alcohol It is important to maintain a healthy blood pressure and healthy cholesterol numbers  Heart disease screening: Screening for heart disease includes  screening for blood pressure, fasting lipids, glucose/diabetes screening, BMI height to weight ratio, reviewed of smoking status, physical activity, and diet.    Goals include blood pressure 120/80 or less, maintaining a healthy lipid/cholesterol profile, preventing diabetes or keeping diabetes numbers under good control, not smoking or using tobacco products, exercising most days per week or at least 150 minutes per week of exercise, and eating healthy variety of fruits and vegetables, healthy oils, and avoiding unhealthy food choices like fried food, fast food, high sugar and high cholesterol foods.    Other tests may possibly include EKG test, CT coronary calcium  score, echocardiogram, exercise treadmill stress test.   Coronary calcium  score of 0 July 2022.    Echocardiogram July 2022 with 60 to 65% ejection fraction, no regional wall motion abnormalities, the only abnormal finding was mild dilation of the left atrium and mild aortic valve sclerosis.     Medical care options: I recommend you continue to seek care here first for routine care.  We try really hard to have available appointments Monday through Friday daytime hours for sick visits, acute visits, and physicals.  Urgent care should be used for after hours and weekends for significant issues that cannot wait till the next day.  The emergency department should be used for significant potentially life-threatening emergencies.  The emergency department is expensive, can often have long wait times for less significant concerns, so try to utilize primary care, urgent care, or telemedicine when possible to avoid unnecessary trips to the emergency department.  Virtual visits and telemedicine have been introduced since the pandemic started in 2020, and can be convenient ways to receive medical care.  We offer virtual appointments as well to assist you in a variety of options to seek medical care.   Advanced Directives: I recommend you consider  completing a Health Care Power of Attorney and Living Will.   These documents respect your wishes and help alleviate burdens on your loved ones if you were to become terminally ill or be in a position to need those documents enforced.    You can complete Advanced Directives yourself, have them notarized, then have copies made for our office, for you and for anybody you feel should have them in safe keeping.  Or, you can have an attorney prepare these documents.   If you haven't updated your Last Will and Testament in a while, it may be worthwhile having an attorney prepare these documents together and save on some costs.        Separate significant issues discussed: Problem List Items Addressed This Visit     Hemorrhoids   Vaccine counseling   Benign prostatic hyperplasia with lower urinary tract symptoms   Relevant Orders   PSA   Routine general medical examination at a health care facility - Primary   Relevant Orders   Comprehensive metabolic panel with GFR   CBC   Lipid panel   PSA   Hemoglobin A1c   Microalbumin/Creatinine Ratio, Urine   Urinalysis, Routine w reflex microscopic   Screening for prostate cancer   Relevant Orders   PSA   Hyperlipidemia   Relevant Orders   Lipid panel   Aortic atherosclerosis (HCC)   Vaccine refused by patient   Other Visit Diagnoses       Screening for  hematuria or proteinuria       Relevant Orders   Urinalysis, Routine w reflex microscopic     Type 2 diabetes mellitus with hyperglycemia, unspecified whether long term insulin  use (HCC)       Relevant Orders   Hemoglobin A1c   Microalbumin/Creatinine Ratio, Urine        Follow-up pending fasting visit for labs, yearly for physical

## 2023-07-18 ENCOUNTER — Other Ambulatory Visit

## 2023-07-18 DIAGNOSIS — E782 Mixed hyperlipidemia: Secondary | ICD-10-CM

## 2023-07-18 DIAGNOSIS — Z125 Encounter for screening for malignant neoplasm of prostate: Secondary | ICD-10-CM

## 2023-07-18 DIAGNOSIS — Z1389 Encounter for screening for other disorder: Secondary | ICD-10-CM

## 2023-07-18 DIAGNOSIS — Z0000 Encounter for general adult medical examination without abnormal findings: Secondary | ICD-10-CM

## 2023-07-18 DIAGNOSIS — E1165 Type 2 diabetes mellitus with hyperglycemia: Secondary | ICD-10-CM | POA: Diagnosis not present

## 2023-07-18 DIAGNOSIS — N401 Enlarged prostate with lower urinary tract symptoms: Secondary | ICD-10-CM | POA: Diagnosis not present

## 2023-07-20 LAB — COMPREHENSIVE METABOLIC PANEL WITH GFR
ALT: 13 IU/L (ref 0–44)
AST: 21 IU/L (ref 0–40)
Albumin: 4.3 g/dL (ref 3.8–4.9)
Alkaline Phosphatase: 90 IU/L (ref 44–121)
BUN/Creatinine Ratio: 14 (ref 9–20)
BUN: 11 mg/dL (ref 6–24)
Bilirubin Total: 0.3 mg/dL (ref 0.0–1.2)
CO2: 21 mmol/L (ref 20–29)
Calcium: 9.3 mg/dL (ref 8.7–10.2)
Chloride: 97 mmol/L (ref 96–106)
Creatinine, Ser: 0.76 mg/dL (ref 0.76–1.27)
Globulin, Total: 2.8 g/dL (ref 1.5–4.5)
Glucose: 79 mg/dL (ref 70–99)
Potassium: 4.3 mmol/L (ref 3.5–5.2)
Sodium: 135 mmol/L (ref 134–144)
Total Protein: 7.1 g/dL (ref 6.0–8.5)
eGFR: 105 mL/min/1.73 (ref >59)

## 2023-07-20 LAB — CBC
Hematocrit: 36.5 % — ABNORMAL LOW (ref 37.5–51.0)
Hemoglobin: 11.3 g/dL — ABNORMAL LOW (ref 13.0–17.7)
MCH: 27.6 pg (ref 26.6–33.0)
MCHC: 31 g/dL — ABNORMAL LOW (ref 31.5–35.7)
MCV: 89 fL (ref 79–97)
Platelets: 282 x10E3/uL (ref 150–450)
RBC: 4.1 x10E6/uL — ABNORMAL LOW (ref 4.14–5.80)
RDW: 16.5 % — ABNORMAL HIGH (ref 11.6–15.4)
WBC: 6.4 x10E3/uL (ref 3.4–10.8)

## 2023-07-20 LAB — MICROALBUMIN / CREATININE URINE RATIO

## 2023-07-20 LAB — LIPID PANEL
Chol/HDL Ratio: 1.8 ratio (ref 0.0–5.0)
Cholesterol, Total: 202 mg/dL — ABNORMAL HIGH (ref 100–199)
HDL: 113 mg/dL (ref >39)
LDL Chol Calc (NIH): 68 mg/dL (ref 0–99)
Triglycerides: 127 mg/dL (ref 0–149)
VLDL Cholesterol Cal: 21 mg/dL (ref 5–40)

## 2023-07-20 LAB — PSA: Prostate Specific Ag, Serum: 2.1 ng/mL (ref 0.0–4.0)

## 2023-07-20 LAB — HEMOGLOBIN A1C
Est. average glucose Bld gHb Est-mCnc: 140 mg/dL
Hgb A1c MFr Bld: 6.5 % — ABNORMAL HIGH (ref 4.8–5.6)

## 2023-07-20 LAB — URINALYSIS, ROUTINE W REFLEX MICROSCOPIC

## 2023-07-21 ENCOUNTER — Ambulatory Visit: Payer: Self-pay | Admitting: Medical

## 2023-07-21 ENCOUNTER — Other Ambulatory Visit: Payer: Self-pay | Admitting: Medical

## 2023-07-21 MED ORDER — ROSUVASTATIN CALCIUM 20 MG PO TABS: 20.0000 mg | ORAL_TABLET | Freq: Every day | ORAL | 3 refills | Status: AC

## 2023-07-21 MED ORDER — GLIPIZIDE 5 MG PO TABS: 5.0000 mg | ORAL_TABLET | Freq: Two times a day (BID) | ORAL | 2 refills | Status: DC

## 2023-07-21 NOTE — Progress Notes (Signed)
 Results sent through MyChart

## 2023-08-31 ENCOUNTER — Ambulatory Visit: Admitting: Medical

## 2023-08-31 VITALS — BP 132/80 | HR 88 | Wt 137.4 lb

## 2023-08-31 DIAGNOSIS — N529 Male erectile dysfunction, unspecified: Secondary | ICD-10-CM

## 2023-08-31 DIAGNOSIS — I700 Atherosclerosis of aorta: Secondary | ICD-10-CM | POA: Diagnosis not present

## 2023-08-31 DIAGNOSIS — E1165 Type 2 diabetes mellitus with hyperglycemia: Secondary | ICD-10-CM | POA: Insufficient documentation

## 2023-08-31 DIAGNOSIS — M542 Cervicalgia: Secondary | ICD-10-CM | POA: Diagnosis not present

## 2023-08-31 DIAGNOSIS — R202 Paresthesia of skin: Secondary | ICD-10-CM

## 2023-08-31 DIAGNOSIS — R200 Anesthesia of skin: Secondary | ICD-10-CM

## 2023-08-31 DIAGNOSIS — E782 Mixed hyperlipidemia: Secondary | ICD-10-CM

## 2023-08-31 MED ORDER — TIZANIDINE HCL 4 MG PO TABS: 4.0000 mg | ORAL_TABLET | Freq: Every evening | ORAL | 0 refills | Status: AC | PRN

## 2023-08-31 MED ORDER — SILDENAFIL CITRATE 100 MG PO TABS: 50.0000 mg | ORAL_TABLET | Freq: Every day | ORAL | 2 refills | Status: AC | PRN

## 2023-08-31 NOTE — Progress Notes (Signed)
 Subjective:  Richard Berg is a 58 y.o. male who presents for Chief Complaint  Patient presents with   Consult    Having some ED issues, having some tingling in right arm with a crick in his neck for the last week, would like his hormones checked     Here with wife for concerns  He has concerns about erectile dysfunction.  Has had problems with erections for several years.  Gets some erections, but not full.   He doesn't like taking pills.  Wants hormones checked  He notes in last 2 weeks having some right arm pain, right neck pain, crick in neck, and right arm tingling.   No fall, no traumas but does do a lot of work overhead daily.  Is right handed.     Non-smoker.  No heavy alcohol use.  Compliant with glipizide  twice daily for diabetes  Compliant with Crestor  daily for hyperlipidemia  No other aggravating or relieving factors.    No other c/o.  Past Medical History:  Diagnosis Date   Constipation    Diabetes mellitus    dx'd 02/15/11   Family history of premature CAD    father MI in early 38s   Hepatitis B immune 03/2014   per titer   Hyperlipidemia    Positive TB test    took the treatment ~ 1993   Current Outpatient Medications on File Prior to Visit  Medication Sig Dispense Refill   glipiZIDE  (GLUCOTROL ) 5 MG tablet Take 1 tablet (5 mg total) by mouth 2 (two) times daily. 180 tablet 2   hydrocortisone  (ANUSOL -HC) 25 MG suppository Place 1 suppository (25 mg total) rectally 2 (two) times daily. 12 suppository 1   hydrocortisone  2.5 % cream Apply topically 2 (two) times daily. 30 g 1   RESTASIS 0.05 % ophthalmic emulsion Place 1 drop into both eyes 2 (two) times daily.     rosuvastatin  (CRESTOR ) 20 MG tablet Take 1 tablet (20 mg total) by mouth daily. 90 tablet 3   No current facility-administered medications on file prior to visit.     The following portions of the patient's history were reviewed and updated as appropriate: allergies, current medications,  past family history, past medical history, past social history, past surgical history and problem list.  ROS Otherwise as in subjective above  Objective: BP 132/80   Pulse 88   Wt 137 lb 6.4 oz (62.3 kg)   SpO2 99%   BMI 20.59 kg/m   General appearance: alert, no distress, well developed, well nourished Neck with decrease extension and mildly reduced left and right rotation.  Tender posterior right neck.  Otherwise no mass, no lymphadenopathy Upper back on the right tender with some spasm.  Otherwise back nontender Right arm nontender, normal range of motion. Arms neurovascularly intact, normal strength and sensation    Assessment: Encounter Diagnoses  Name Primary?   Erectile dysfunction, unspecified erectile dysfunction type Yes   Neck pain    Numbness and tingling of right arm    Aortic atherosclerosis (HCC)    Mixed hyperlipidemia    Type 2 diabetes mellitus with hyperglycemia, unspecified whether long term insulin  use (HCC)      Plan: Erectile dysfunction -discussed possible causes.  Testosterone  level today.  He has underlying high cholesterol and diabetes which are likely the contributing factors.  Stress is likely playing a role as well.  He has tried Viagra  in the remote past back in 2018.  Begin trial of Viagra  again.  Discussed proper use of medication.  Discussed Cialis alternative.  Discussed vacuum erection device and other treatment options.  Neck pain, arm tingling and pa in-likely inflammation in the neck and upper back etiology.  Advise relative rest for the next week, use of arm sling over-the-counter periodically over the next few days.  He can use some over-the-counter Aleve for the next few days for pain and inflammation.  He can use tizanidine  muscle relaxer as needed the next few nights.  Advised stretching, range of motion activity and consider massage therapy.  If worse or not improving over the next week then to recheck as we may need to consider imaging  and other treatment  Hyperlipidemia-continue rosuvastatin  Crestor  20 mg daily  Diabetes-continue glipizide  5 mg twice daily  Not on ACE or ARB due to allergy  Borderline blood pressure-advise home monitoring of blood pressures.  Tavita was seen today for consult.  Diagnoses and all orders for this visit:  Erectile dysfunction, unspecified erectile dysfunction type -     Testosterone   Neck pain  Numbness and tingling of right arm  Aortic atherosclerosis (HCC)  Mixed hyperlipidemia  Type 2 diabetes mellitus with hyperglycemia, unspecified whether long term insulin  use (HCC)  Other orders -     sildenafil  (VIAGRA ) 100 MG tablet; Take 0.5-1 tablets (50-100 mg total) by mouth daily as needed for erectile dysfunction. -     tiZANidine  (ZANAFLEX ) 4 MG tablet; Take 1 tablet (4 mg total) by mouth at bedtime as needed for muscle spasms.    Follow up: Pending labs, trial of Viagra 

## 2023-08-31 NOTE — Patient Instructions (Signed)

## 2023-09-01 ENCOUNTER — Ambulatory Visit: Payer: Self-pay | Admitting: Medical

## 2023-09-01 LAB — TESTOSTERONE: Testosterone: 576 ng/dL (ref 264–916)

## 2023-09-07 ENCOUNTER — Other Ambulatory Visit: Payer: Self-pay | Admitting: Medical

## 2023-10-19 ENCOUNTER — Encounter: Payer: Self-pay | Admitting: Family Medicine

## 2023-10-19 ENCOUNTER — Ambulatory Visit: Admitting: Family Medicine

## 2023-10-19 ENCOUNTER — Ambulatory Visit: Payer: Self-pay

## 2023-10-19 VITALS — BP 128/76 | HR 84 | Ht 69.0 in | Wt 139.4 lb

## 2023-10-19 DIAGNOSIS — M7711 Lateral epicondylitis, right elbow: Secondary | ICD-10-CM

## 2023-10-19 DIAGNOSIS — Z2821 Immunization not carried out because of patient refusal: Secondary | ICD-10-CM | POA: Diagnosis not present

## 2023-10-19 NOTE — Telephone Encounter (Signed)
 FYI Only or Action Required?: FYI only for provider.  Patient was last seen in primary care on 08/31/2023 by [name], PA-C.  Called Nurse Triage reporting Arm Pain.  Symptoms began a week ago.  Interventions attempted: Ice/heat application.  Symptoms are: unchanged.  Triage Disposition: See PCP When Office is Open (Within 3 Days)  Patient/caregiver understands and will follow disposition?: Yes Copied from CRM [id number]. Topic: Clinical - Red Word Triage >> Oct 19, 2023 12:13 PM Richard Berg wrote: Red Word that prompted transfer to Nurse Triage: Patient says last visit he had a issue with his right arm, and now hes in severe pain. Tingling in his forearm. When he picks things up, he's in pain Reason for Disposition  [1] MODERATE pain (e.g., interferes with normal activities) AND [2] present > 3 days  Answer Assessment - Initial Assessment Questions Scheduled appt 10/19/23 with alternative provider. Advised call back if symptoms worsen, or UC/ED.  1. ONSET: When did the pain start?     3 weeks 2. LOCATION: Where is the pain located?     Right elbow 3. PAIN: How bad is the pain? (Scale 0-10; or none, mild, moderate, severe)     Intermittent; when trying to pick objects 8/10 4. CAUSE: What do you think is causing the arm pain?     Hit elbow when pulling; ice/ heat 5. OTHER SYMPTOMS: Do you have any other symptoms? (e.g., neck pain, swelling, rash, fever, numbness, weakness)     Tingling in forearm;intermittent. Denies cuts, bruising, swelling, numbness, weakness  Protocols used: Arm Pain-A-AH

## 2023-10-19 NOTE — Patient Instructions (Signed)
 Actually this is called tennis elbow.  Heat to your elbow for 20 minutes 3 times per day and take 2 Aleve twice per day and learn to do his many things you can palms up

## 2023-10-19 NOTE — Progress Notes (Signed)
   Subjective:    Patient ID: Richard Berg, male    DOB: 31-Aug-1965, 58 y.o.   MRN: [id number]  Discussed the use of AI scribe software for clinical note transcription with the patient, who gave verbal consent to proceed.  History of Present Illness   Richard Berg is a 58 year old male who presents with elbow pain.  He has been experiencing elbow pain for several weeks, which began after pulling a strap while moving a washing machine. He felt something in his elbow at that time.  The pain is exacerbated by his work activities, which involve moving heavy oxygen cylinders. He noted significant pain when picking up an E size oxygen cylinder by the valve, describing it as 'hurt like hell'.  He has not used any specific treatments for the elbow pain prior to this visit, but he did apply ice after returning home yesterday.  He works for a [employer], filling oxygen cylinders for hospitals.           Review of Systems     Objective:    Physical Exam Physical Exam   MUSCULOSKELETAL: Mild tenderness to palpation over the lateral epicondyle.  Normal motion of the elbow.          Assessment & Plan:  Assessment and Plan    Lateral epicondylitis (tennis elbow), right elbow  - Apply heat to right elbow for 20 minutes, three times daily. - Take two Aleve (naproxen) twice daily. - Perform activities with palms up to reduce extensor muscle strain. - Consider physical therapy referral if no improvement in 1-2 weeks.  General Health Maintenance Declined immunizations despite acknowledging need.

## 2023-11-30 NOTE — Progress Notes (Signed)
 Richard Berg                                          MRN: [id number]   11/30/2023   The [facility] Quality Team Specialist reviewed this patient medical record for the purposes of chart review for care gap closure. The following were reviewed: chart review for care gap closure-kidney health evaluation for diabetes:eGFR  and uACR.    [facility] Quality Team

## 2024-01-17 ENCOUNTER — Ambulatory Visit: Payer: Self-pay | Admitting: Medical

## 2024-01-17 ENCOUNTER — Encounter: Payer: Self-pay | Admitting: Medical

## 2024-01-17 VITALS — BP 134/72 | HR 84 | Ht 69.0 in | Wt 135.0 lb

## 2024-01-17 DIAGNOSIS — N401 Enlarged prostate with lower urinary tract symptoms: Secondary | ICD-10-CM

## 2024-01-17 DIAGNOSIS — E1165 Type 2 diabetes mellitus with hyperglycemia: Secondary | ICD-10-CM

## 2024-01-17 DIAGNOSIS — D649 Anemia, unspecified: Secondary | ICD-10-CM

## 2024-01-17 DIAGNOSIS — K649 Unspecified hemorrhoids: Secondary | ICD-10-CM

## 2024-01-17 DIAGNOSIS — E782 Mixed hyperlipidemia: Secondary | ICD-10-CM | POA: Diagnosis not present

## 2024-01-17 MED ORDER — HYDROCORTISONE 2.5 % EX CREA: TOPICAL_CREAM | Freq: Two times a day (BID) | CUTANEOUS | 1 refills | Status: AC

## 2024-01-17 NOTE — Progress Notes (Signed)
 "  Name: Richard Berg   Date of Visit: 01/17/2024   Date of last visit with me: 09/07/2023   CHIEF COMPLAINT:  Chief Complaint  Patient presents with   Diabetes    Patient is here for med check, he forgot to fast. No new concerns. Last FBS 92-115.        HPI:  Discussed the use of AI scribe software for clinical note transcription with the patient, who gave verbal consent to proceed.  History of Present Illness   Richard Berg is a 59 year old male who presents for evaluation of anemia and prostate health.  He has a history of mild anemia and experiences significant bleeding from hemorrhoids more than once a week, which is sometimes alarming. A colonoscopy two and a half years ago revealed a large 40mm polyp, and he is due for another colonoscopy this year. His diet is often lacking due to his work schedule, typically skipping breakfast and only eating lunch and dinner, which may contribute to his anemia.  He has a history of elevated PSA levels, with a recent increase from 1.1 to 2.1 over the past year. His PSA levels have been gradually increasing over the past four years, prompting further monitoring.  He has type 2 diabetes mellitus, with blood sugar levels ranging from 90 to 135. He is not currently taking glipizide  for diabetes management. His A1c was last recorded at 6.5%.  He commutes to work in [place], requiring him to wake up at 2:00 AM and drive for about an hour each way, impacting his daily routine and meal schedule.  ROS as in subjective  Past Medical History:  Diagnosis Date   Constipation    Diabetes mellitus    dx'd 02/15/11   Family history of premature CAD    father MI in early 104s   Hepatitis B immune 03/2014   per titer   Hyperlipidemia    Positive TB test    took the treatment ~ 1993    Medications Ordered Prior to Encounter[1]    OBJECTIVE:    BP 134/72   Pulse 84   Ht 5' 9 (1.753 m)   Wt 135 lb (61.2 kg)   BMI 19.94 kg/m    BP Readings from Last 3 Encounters:  01/17/24 134/72  10/19/23 128/76  08/31/23 132/80    Wt Readings from Last 3 Encounters:  01/17/24 135 lb (61.2 kg)  10/19/23 139 lb 6.4 oz (63.2 kg)  08/31/23 137 lb 6.4 oz (62.3 kg)    General appearence: alert, no distress, WD/WN,  Abdomen: +bs, soft, non tender, non distended, no masses, no hepatomegaly, no splenomegaly Pulses: 2+ symmetric, upper and lower extremities, normal cap refill Multiple moderate-sized external hemorrhoids of the anus   ASSESSMENT/PLAN:   Encounter Diagnoses  Name Primary?   Type 2 diabetes mellitus with hyperglycemia, unspecified whether long term insulin  use (HCC) Yes   Benign prostatic hyperplasia with lower urinary tract symptoms, symptom details unspecified    Hemorrhoids, unspecified hemorrhoid type    Anemia, unspecified type    Mixed hyperlipidemia      Hemorrhoids Chronic with intermittent significant bleeding. External hemorrhoids present. Possible surgical need if symptoms persist. - Referred to general surgeon for consultation. - Advised increased water intake and dietary fiber. - Recommended sitz baths and OTC hydrocortisone  cream prn for flare ups  Anemia Mild anemia with low hemoglobin. Possible nutritional deficiencies and blood loss from hemorrhoids. Due for colonoscopy repeat this year 2026. - Ordered  blood work for hemoglobin and iron. - Advised increased intake of meat, vegetables, grains. - Recommended daily multivitamin. - Consider expedited colonoscopy /GI follow up  Type 2 diabetes mellitus -currently diet controlled Last A1c 6.5% summer 2025 - Ordered A1c test. - Ordered microalbumin test.  Benign prostatic hyperplasia with lower urinary tract symptoms PSA increased on last few labs - Ordered PSA test.  General Health Maintenance Routine health maintenance discussed. - Ensure adequate hydration and dietary fiber.  Hyperlipidemia -Continue Crestor  20 mg  daily   Richard Berg was seen today for diabetes.  Diagnoses and all orders for this visit:  Type 2 diabetes mellitus with hyperglycemia, unspecified whether long term insulin  use (HCC) -     Hemoglobin A1c -     Microalbumin/Creatinine Ratio, Urine  Benign prostatic hyperplasia with lower urinary tract symptoms, symptom details unspecified -     PSA  Hemorrhoids, unspecified hemorrhoid type -     CBC with Differential/Platelet -     Iron, TIBC and Ferritin Panel -     Ambulatory referral to General Surgery  Anemia, unspecified type -     CBC with Differential/Platelet -     Iron, TIBC and Ferritin Panel -     Ambulatory referral to General Surgery  Mixed hyperlipidemia  Other orders -     hydrocortisone  2.5 % cream; Apply topically 2 (two) times daily.   F/u pending labs and referral  [facility]    [1]  Current Outpatient Medications on File Prior to Visit  Medication Sig Dispense Refill   RESTASIS 0.05 % ophthalmic emulsion Place 1 drop into both eyes 2 (two) times daily.     rosuvastatin  (CRESTOR ) 20 MG tablet Take 1 tablet (20 mg total) by mouth daily. 90 tablet 3   sildenafil  (VIAGRA ) 100 MG tablet Take 0.5-1 tablets (50-100 mg total) by mouth daily as needed for erectile dysfunction. 30 tablet 2   tiZANidine  (ZANAFLEX ) 4 MG tablet Take 1 tablet (4 mg total) by mouth at bedtime as needed for muscle spasms. 15 tablet 0   No current facility-administered medications on file prior to visit.   "

## 2024-01-18 ENCOUNTER — Ambulatory Visit: Payer: Self-pay | Admitting: Medical

## 2024-01-18 ENCOUNTER — Other Ambulatory Visit: Payer: Self-pay | Admitting: Medical

## 2024-01-18 LAB — CBC WITH DIFFERENTIAL/PLATELET
Basophils Absolute: 0 x10E3/uL (ref 0.0–0.2)
Basos: 1 %
EOS (ABSOLUTE): 0 x10E3/uL (ref 0.0–0.4)
Eos: 1 %
Hematocrit: 34.7 % — ABNORMAL LOW (ref 37.5–51.0)
Hemoglobin: 10.8 g/dL — ABNORMAL LOW (ref 13.0–17.7)
Immature Grans (Abs): 0 x10E3/uL (ref 0.0–0.1)
Immature Granulocytes: 0 %
Lymphocytes Absolute: 1.6 x10E3/uL (ref 0.7–3.1)
Lymphs: 38 %
MCH: 26.9 pg (ref 26.6–33.0)
MCHC: 31.1 g/dL — ABNORMAL LOW (ref 31.5–35.7)
MCV: 87 fL (ref 79–97)
Monocytes Absolute: 0.7 x10E3/uL (ref 0.1–0.9)
Monocytes: 16 %
Neutrophils Absolute: 1.9 x10E3/uL (ref 1.4–7.0)
Neutrophils: 44 %
Platelets: 304 x10E3/uL (ref 150–450)
RBC: 4.01 x10E6/uL — ABNORMAL LOW (ref 4.14–5.80)
RDW: 17.1 % — ABNORMAL HIGH (ref 11.6–15.4)
WBC: 4.3 x10E3/uL (ref 3.4–10.8)

## 2024-01-18 LAB — PSA: Prostate Specific Ag, Serum: 0.6 ng/mL (ref 0.0–4.0)

## 2024-01-18 LAB — IRON,TIBC AND FERRITIN PANEL
Ferritin: 20 ng/mL — ABNORMAL LOW (ref 30–400)
Iron Saturation: 10 % — ABNORMAL LOW (ref 15–55)
Iron: 57 ug/dL (ref 38–169)
Total Iron Binding Capacity: 565 ug/dL (ref 250–450)
UIBC: 508 ug/dL — ABNORMAL HIGH (ref 111–343)

## 2024-01-18 LAB — MICROALBUMIN / CREATININE URINE RATIO
Creatinine, Urine: 104 mg/dL
Microalb/Creat Ratio: 5 mg/g{creat} (ref 0–29)
Microalbumin, Urine: 4.7 ug/mL

## 2024-01-18 LAB — HEMOGLOBIN A1C
Est. average glucose Bld gHb Est-mCnc: 128 mg/dL
Hgb A1c MFr Bld: 6.1 % — ABNORMAL HIGH (ref 4.8–5.6)

## 2024-01-18 MED ORDER — FERROUS GLUCONATE 324 (38 FE) MG PO TABS: 324.0000 mg | ORAL_TABLET | Freq: Every day | ORAL | 0 refills | Status: AC

## 2024-01-18 NOTE — Progress Notes (Signed)
 Results to MyChart

## 2024-01-18 NOTE — Progress Notes (Signed)
 Results through MyChart

## 2024-07-17 ENCOUNTER — Encounter: Payer: Self-pay | Admitting: Medical
